# Patient Record
Sex: Female | Born: 1985 | Race: White | Hispanic: No | Smoking: Never smoker
Health system: Southern US, Community
[De-identification: ages and names within clinical notes are randomized; demographics above are authoritative.]

## PROBLEM LIST (undated history)

## (undated) DIAGNOSIS — Z87442 Personal history of urinary calculi: Secondary | ICD-10-CM

## (undated) DIAGNOSIS — Z87448 Personal history of other diseases of urinary system: Secondary | ICD-10-CM

## (undated) DIAGNOSIS — Z8669 Personal history of other diseases of the nervous system and sense organs: Secondary | ICD-10-CM

## (undated) DIAGNOSIS — K219 Gastro-esophageal reflux disease without esophagitis: Secondary | ICD-10-CM

## (undated) DIAGNOSIS — E079 Disorder of thyroid, unspecified: Secondary | ICD-10-CM

## (undated) DIAGNOSIS — M797 Fibromyalgia: Secondary | ICD-10-CM

## (undated) DIAGNOSIS — M1990 Unspecified osteoarthritis, unspecified site: Secondary | ICD-10-CM

## (undated) HISTORY — DX: Personal history of other diseases of the nervous system and sense organs: Z86.69

## (undated) HISTORY — DX: Personal history of urinary calculi: Z87.442

## (undated) HISTORY — DX: Fibromyalgia: M79.7

## (undated) HISTORY — DX: Gastro-esophageal reflux disease without esophagitis: K21.9

## (undated) HISTORY — DX: Unspecified osteoarthritis, unspecified site: M19.90

## (undated) HISTORY — DX: Disorder of thyroid, unspecified: E07.9

## (undated) HISTORY — DX: Personal history of other diseases of urinary system: Z87.448

---
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
## 2006-08-11 ENCOUNTER — Emergency Department: Payer: Self-pay | Admitting: Unknown Physician Specialty

## 2006-08-22 ENCOUNTER — Emergency Department: Payer: Self-pay | Admitting: Emergency Medicine

## 2006-11-03 ENCOUNTER — Emergency Department: Payer: Self-pay | Admitting: Unknown Physician Specialty

## 2007-02-07 ENCOUNTER — Observation Stay: Payer: Self-pay | Admitting: Unknown Physician Specialty

## 2007-02-08 ENCOUNTER — Ambulatory Visit: Payer: Self-pay | Admitting: Unknown Physician Specialty

## 2007-04-10 ENCOUNTER — Observation Stay: Payer: Self-pay | Admitting: Obstetrics and Gynecology

## 2007-04-20 ENCOUNTER — Observation Stay: Payer: Self-pay

## 2007-05-01 ENCOUNTER — Inpatient Hospital Stay: Payer: Self-pay | Admitting: Unknown Physician Specialty

## 2007-05-01 ENCOUNTER — Observation Stay: Payer: Self-pay | Admitting: Unknown Physician Specialty

## 2007-10-04 ENCOUNTER — Emergency Department: Payer: Self-pay | Admitting: Emergency Medicine

## 2008-02-03 ENCOUNTER — Emergency Department: Payer: Self-pay | Admitting: Emergency Medicine

## 2008-06-24 ENCOUNTER — Emergency Department: Payer: Self-pay | Admitting: Emergency Medicine

## 2009-01-01 ENCOUNTER — Observation Stay: Payer: Self-pay

## 2009-01-23 ENCOUNTER — Observation Stay: Payer: Self-pay

## 2009-02-03 ENCOUNTER — Ambulatory Visit: Payer: Self-pay | Admitting: Obstetrics & Gynecology

## 2009-02-04 ENCOUNTER — Inpatient Hospital Stay: Payer: Self-pay | Admitting: Obstetrics & Gynecology

## 2009-04-26 ENCOUNTER — Emergency Department: Payer: Self-pay | Admitting: Emergency Medicine

## 2009-08-23 ENCOUNTER — Emergency Department: Payer: Self-pay | Admitting: Emergency Medicine

## 2010-02-01 ENCOUNTER — Emergency Department: Payer: Self-pay | Admitting: Emergency Medicine

## 2010-11-13 DIAGNOSIS — Z87448 Personal history of other diseases of urinary system: Secondary | ICD-10-CM

## 2010-11-13 HISTORY — DX: Personal history of other diseases of urinary system: Z87.448

## 2010-11-13 HISTORY — PX: BREAST ENHANCEMENT SURGERY: SHX7

## 2011-07-31 ENCOUNTER — Emergency Department: Payer: Self-pay | Admitting: Emergency Medicine

## 2011-08-05 ENCOUNTER — Emergency Department: Payer: Self-pay | Admitting: Internal Medicine

## 2011-10-23 ENCOUNTER — Emergency Department: Payer: Self-pay | Admitting: Emergency Medicine

## 2011-10-24 ENCOUNTER — Emergency Department: Payer: Self-pay | Admitting: Emergency Medicine

## 2011-11-10 ENCOUNTER — Emergency Department: Payer: Self-pay | Admitting: Emergency Medicine

## 2011-11-14 ENCOUNTER — Encounter: Payer: Self-pay | Admitting: *Deleted

## 2011-11-14 ENCOUNTER — Observation Stay (HOSPITAL_COMMUNITY)
Admission: EM | Admit: 2011-11-14 | Discharge: 2011-11-15 | Disposition: A | Discharge status: Home / Self Care | Payer: BC Managed Care – PPO | Attending: Emergency Medicine | Admitting: Emergency Medicine

## 2011-11-14 DIAGNOSIS — R509 Fever, unspecified: Secondary | ICD-10-CM | POA: Insufficient documentation

## 2011-11-14 DIAGNOSIS — Z87442 Personal history of urinary calculi: Secondary | ICD-10-CM

## 2011-11-14 DIAGNOSIS — N12 Tubulo-interstitial nephritis, not specified as acute or chronic: Principal | ICD-10-CM | POA: Insufficient documentation

## 2011-11-14 DIAGNOSIS — R300 Dysuria: Secondary | ICD-10-CM | POA: Insufficient documentation

## 2011-11-14 DIAGNOSIS — R110 Nausea: Secondary | ICD-10-CM | POA: Insufficient documentation

## 2011-11-14 HISTORY — DX: Personal history of urinary calculi: Z87.442

## 2011-11-14 LAB — URINALYSIS, ROUTINE W REFLEX MICROSCOPIC
Bilirubin Urine: NEGATIVE
Glucose, UA: NEGATIVE mg/dL
Ketones, ur: NEGATIVE mg/dL
Nitrite: POSITIVE — AB
Protein, ur: 100 mg/dL — AB
Specific Gravity, Urine: 1.007 (ref 1.005–1.030)
Urobilinogen, UA: 1 mg/dL (ref 0.0–1.0)
pH: 6.5 (ref 5.0–8.0)

## 2011-11-14 LAB — URINE MICROSCOPIC-ADD ON

## 2011-11-14 LAB — POCT PREGNANCY, URINE: Preg Test, Ur: NEGATIVE

## 2011-11-14 MED ORDER — MORPHINE SULFATE 4 MG/ML IJ SOLN
4.0000 mg | Freq: Once | INTRAMUSCULAR | Status: AC
  Administered 2011-11-14: 4 mg via INTRAVENOUS
  Filled 2011-11-14: qty 1

## 2011-11-14 MED ORDER — SODIUM CHLORIDE 0.9 % IV BOLUS (SEPSIS)
1000.0000 mL | Freq: Once | INTRAVENOUS | Status: AC
  Administered 2011-11-14: 1000 mL via INTRAVENOUS

## 2011-11-14 MED ORDER — DEXTROSE 5 % IV SOLN
1.0000 g | Freq: Once | INTRAVENOUS | Status: AC
  Administered 2011-11-14: 1 g via INTRAVENOUS
  Filled 2011-11-14: qty 10

## 2011-11-14 MED ORDER — ONDANSETRON HCL 4 MG/2ML IJ SOLN
4.0000 mg | Freq: Once | INTRAMUSCULAR | Status: AC
  Administered 2011-11-14: 4 mg via INTRAVENOUS
  Filled 2011-11-14: qty 2

## 2011-11-14 NOTE — ED Notes (Signed)
She has had rt flank pain and painful urination for 5 days.  She was seen at an urgent care this am in [place] and given rxs but she did not get them filled she was told she had a uti.  lmp dec 1st

## 2011-11-14 NOTE — ED Provider Notes (Signed)
History     CSN: [id number]  Arrival date & time 11/14/11  2041   First MD Initiated Contact with Patient 11/14/11 2312      Chief Complaint  Patient presents with  . Flank Pain    (Consider location/radiation/quality/duration/timing/severity/associated sxs/prior treatment) HPI Comments: Patient with right flank pain that began yesterday. The patient is also having dysuria. She denies vaginal discharge. She has nausea but no vomiting. Patient has a low-grade fever in the 99's degrees Fahrenheit. Normal last menstrual period. Patient denies abdominal pain, upper respiratory tract infection symptoms, shortness of breath, cough.  Patient is a 26 y.o. female presenting with flank pain. The history is provided by the patient.  Flank Pain This is a new problem. The current episode started yesterday. The problem has been gradually worsening. Associated symptoms include a fever and nausea. Pertinent negatives include no abdominal pain, chest pain, chills, headaches, myalgias, rash, sore throat or vomiting. The symptoms are aggravated by nothing. She has tried nothing for the symptoms.  Flank Pain This is a new problem. The current episode started yesterday. The problem has been gradually worsening. Pertinent negatives include no chest pain, no abdominal pain, no headaches and no shortness of breath. The symptoms are aggravated by nothing. She has tried nothing for the symptoms.    Past Medical History  Diagnosis Date  . Hydronephrosis     History reviewed. No pertinent past surgical history.  History reviewed. No pertinent family history.  History  Substance Use Topics  . Smoking status: Never Smoker   . Smokeless tobacco: Not on file  . Alcohol Use: Yes    OB History    Grav Para Term Preterm Abortions TAB SAB Ect Mult Living                  Review of Systems  Constitutional: Positive for fever. Negative for chills.  HENT: Negative for sore throat and rhinorrhea.   Eyes:  Negative for discharge.  Respiratory: Negative for shortness of breath.   Cardiovascular: Negative for chest pain.  Gastrointestinal: Positive for nausea. Negative for vomiting, abdominal pain, diarrhea and constipation.  Genitourinary: Positive for dysuria and flank pain. Negative for hematuria, vaginal bleeding, vaginal discharge and pelvic pain.  Musculoskeletal: Negative for myalgias.  Skin: Negative for rash.  Neurological: Negative for headaches.  Psychiatric/Behavioral: Negative for confusion.    Allergies  Review of patient's allergies indicates no known allergies.  Home Medications  No current outpatient prescriptions on file.  BP 125/82  Pulse 111  Temp(Src) 99 F (37.2 C) (Oral)  Resp 18  SpO2 98%  LMP 10/14/2011  Physical Exam  Nursing note and vitals reviewed. Constitutional: She is oriented to person, place, and time. She appears well-developed and well-nourished.  HENT:  Head: Normocephalic and atraumatic.  Eyes: Pupils are equal, round, and reactive to light. Right eye exhibits no discharge. Left eye exhibits no discharge.  Neck: Normal range of motion. Neck supple.  Cardiovascular: Regular rhythm.   No murmur heard.      Mild tachycardia  Pulmonary/Chest: Effort normal and breath sounds normal. No respiratory distress. She has no wheezes.  Abdominal: Soft. Bowel sounds are normal. There is tenderness in the suprapubic area. There is CVA tenderness. There is no rebound, no guarding, no tenderness at McBurney's point and negative Murphy's sign.       Right CVA tenderness.  Musculoskeletal: Normal range of motion.  Neurological: She is alert and oriented to person, place, and time.  Skin: Skin is  warm and dry. No rash noted.  Psychiatric: She has a normal mood and affect.    ED Course  Procedures (including critical care time)  Labs Reviewed  URINALYSIS, ROUTINE W REFLEX MICROSCOPIC - Abnormal; Notable for the following:    Color, Urine ORANGE (*)  BIOCHEMICALS MAY BE AFFECTED BY COLOR   APPearance CLOUDY (*)    Hgb urine dipstick LARGE (*)    Protein, ur 100 (*)    Nitrite POSITIVE (*)    Leukocytes, UA LARGE (*)    All other components within normal limits  URINE MICROSCOPIC-ADD ON - Abnormal; Notable for the following:    Squamous Epithelial / LPF MANY (*)    Bacteria, UA MANY (*)    All other components within normal limits  POCT PREGNANCY, URINE  POCT PREGNANCY, URINE  URINE CULTURE   No results found.   1. Pyelonephritis     12:30 AM patient was seen and examined. IV fluids given. Pain medicine given. IV antibiotics ordered. Suspect pyelonephritis. Will monitor.  4:04 AM patient require more pain medicine. She is sleeping in the CDU.  5:57 AM patient seen. She states she is feeling much better. Will give patient breakfast and pain medicine. If she tolerates this will discharge home on PO Vantin, pain medicine, zofran. Patient urged to go to urgent care in 3 days for recheck of her urine. Culture sent. Handoff to [facility] PA-C who will discharge if patient tolerates PO's. Patient urged to return with persistent vomiting, worsening pain, persistent high fever. She verbalizes understanding and agrees with the plan.  MDM  Patients with pyelonephritis. Treated and improved in emergency department. She is tolerating fluids. Pain is improved. She is able to be discharged home outpatient therapy.   Eustace Moore [place], [place] 11/15/11 [phone]  Medical screening examination/treatment/procedure(s) were conducted as a shared visit with non-physician practitioner(s) and myself.  I personally evaluated the patient during the encounter. Patient evaluated for flank pain, tachycardia and symptoms and presentation consistent with pyelonephritis. Right CVA tenderness on exam. She is placed on is protocol overnight given IV antibiotics IV fluids and pain control. She tolerated these treatments well and on reassessment was doing much better. Urine  culture is pending patient to be discharged home with outpatient followup.  Sunnie Nielsen, MD 11/15/11 [phone]

## 2011-11-15 MED ORDER — OXYCODONE-ACETAMINOPHEN 5-325 MG PO TABS: 1.0000 | ORAL_TABLET | Freq: Four times a day (QID) | ORAL | Status: AC | PRN

## 2011-11-15 MED ORDER — ACETAMINOPHEN 325 MG PO TABS: 650.0000 mg | ORAL_TABLET | Freq: Four times a day (QID) | ORAL | Status: DC | PRN

## 2011-11-15 MED ORDER — ONDANSETRON 4 MG PO TBDP
ORAL_TABLET | ORAL | Status: AC
  Administered 2011-11-15: 4 mg
  Filled 2011-11-15: qty 1

## 2011-11-15 MED ORDER — OXYCODONE-ACETAMINOPHEN 5-325 MG PO TABS
ORAL_TABLET | ORAL | Status: AC
  Administered 2011-11-15: 1
  Filled 2011-11-15: qty 1

## 2011-11-15 MED ORDER — CEFPODOXIME PROXETIL 200 MG PO TABS: 200.0000 mg | ORAL_TABLET | Freq: Two times a day (BID) | ORAL | Status: AC

## 2011-11-15 MED ORDER — MORPHINE SULFATE 4 MG/ML IJ SOLN
6.0000 mg | Freq: Once | INTRAMUSCULAR | Status: AC
  Administered 2011-11-15: 6 mg via INTRAVENOUS
  Filled 2011-11-15: qty 2

## 2011-11-15 MED ORDER — IBUPROFEN 200 MG PO TABS: 600.0000 mg | ORAL_TABLET | ORAL | Status: DC | PRN

## 2011-11-15 MED ORDER — ONDANSETRON 8 MG PO TBDP: 8.0000 mg | ORAL_TABLET | Freq: Three times a day (TID) | ORAL | Status: AC | PRN

## 2011-11-15 MED ORDER — OXYCODONE-ACETAMINOPHEN 5-325 MG PO TABS
1.0000 | ORAL_TABLET | Freq: Once | ORAL | Status: AC
  Administered 2011-11-15: 1 via ORAL

## 2011-11-15 NOTE — ED Notes (Signed)
Phoned lab to add urine culture to previously submitted sample; sent requisition to lab

## 2011-11-15 NOTE — ED Notes (Signed)
Report given to [facility], Charity fundraiser.  Pt and family updated that she is moving to CDU.

## 2011-11-16 LAB — URINE CULTURE
Colony Count: >=100000
Culture  Setup Time: 201301020829

## 2011-11-17 NOTE — ED Notes (Signed)
+   urine Patient treated with Vantin-sensitive to same-chart appended per protocol MD.

## 2012-01-15 ENCOUNTER — Emergency Department: Payer: Self-pay | Admitting: Emergency Medicine

## 2012-01-15 LAB — PREGNANCY, URINE: Pregnancy Test, Urine: NEGATIVE m[IU]/mL

## 2012-06-22 ENCOUNTER — Observation Stay: Payer: Self-pay

## 2012-07-19 ENCOUNTER — Observation Stay: Payer: Self-pay | Admitting: Obstetrics & Gynecology

## 2012-07-19 LAB — FETAL FIBRONECTIN
Appearance: NORMAL
Fetal Fibronectin: NEGATIVE

## 2012-07-19 LAB — URINALYSIS, COMPLETE
Bacteria: NONE SEEN
Bilirubin,UR: NEGATIVE
Blood: NEGATIVE
Glucose,UR: NEGATIVE mg/dL (ref 0–75)
Leukocyte Esterase: NEGATIVE
Nitrite: NEGATIVE
Ph: 7 (ref 4.5–8.0)
Protein: NEGATIVE
RBC,UR: <1 /HPF (ref 0–5)
Specific Gravity: 1.003 (ref 1.003–1.030)
Squamous Epithelial: <1
WBC UR: NONE SEEN /HPF (ref 0–5)

## 2012-08-13 HISTORY — PX: TUBAL LIGATION: SHX77

## 2012-08-16 ENCOUNTER — Observation Stay: Payer: Self-pay | Admitting: Obstetrics and Gynecology

## 2012-08-16 LAB — CBC WITH DIFFERENTIAL/PLATELET
Basophil #: 0 10*3/uL (ref 0.0–0.1)
Basophil %: 0.4 %
Eosinophil #: 0.5 10*3/uL (ref 0.0–0.7)
Eosinophil %: 3.9 %
HCT: 30.4 % — ABNORMAL LOW (ref 35.0–47.0)
HGB: 10.5 g/dL — ABNORMAL LOW (ref 12.0–16.0)
Lymphocyte #: 2.1 10*3/uL (ref 1.0–3.6)
Lymphocyte %: 18.3 %
MCH: 29.4 pg (ref 26.0–34.0)
MCHC: 34.4 g/dL (ref 32.0–36.0)
MCV: 86 fL (ref 80–100)
Monocyte #: 0.5 x10 3/mm (ref 0.2–0.9)
Monocyte %: 3.9 %
Neutrophil #: 8.4 10*3/uL — ABNORMAL HIGH (ref 1.4–6.5)
Neutrophil %: 73.5 %
Platelet: 164 10*3/uL (ref 150–440)
RBC: 3.56 10*6/uL — ABNORMAL LOW (ref 3.80–5.20)
RDW: 13.2 % (ref 11.5–14.5)
WBC: 11.5 10*3/uL — ABNORMAL HIGH (ref 3.6–11.0)

## 2012-08-16 LAB — URINALYSIS, COMPLETE
Bacteria: NONE SEEN
Bilirubin,UR: NEGATIVE
Blood: NEGATIVE
Glucose,UR: NEGATIVE mg/dL (ref 0–75)
Leukocyte Esterase: NEGATIVE
Nitrite: NEGATIVE
Ph: 6 (ref 4.5–8.0)
Protein: NEGATIVE
RBC,UR: NONE SEEN /HPF (ref 0–5)
Specific Gravity: 1.01 (ref 1.003–1.030)
Squamous Epithelial: 1
WBC UR: 1 /HPF (ref 0–5)

## 2012-08-16 LAB — BASIC METABOLIC PANEL
Anion Gap: 11 (ref 7–16)
BUN: 4 mg/dL — ABNORMAL LOW (ref 7–18)
Calcium, Total: 8.2 mg/dL — ABNORMAL LOW (ref 8.5–10.1)
Chloride: 107 mmol/L (ref 98–107)
Co2: 22 mmol/L (ref 21–32)
Creatinine: 0.44 mg/dL — ABNORMAL LOW (ref 0.60–1.30)
EGFR (African American): >60
EGFR (Non-African Amer.): >60
Glucose: 76 mg/dL (ref 65–99)
Osmolality: 275 (ref 275–301)
Potassium: 3.2 mmol/L — ABNORMAL LOW (ref 3.5–5.1)
Sodium: 140 mmol/L (ref 136–145)

## 2012-08-18 LAB — URINE CULTURE

## 2012-09-02 ENCOUNTER — Observation Stay: Payer: Self-pay

## 2012-09-03 LAB — CBC WITH DIFFERENTIAL/PLATELET
Basophil #: 0.1 10*3/uL
Basophil %: 0.5 %
Eosinophil #: 0.6 10*3/uL
Eosinophil %: 3.7 %
HCT: 33.7 % — ABNORMAL LOW
HGB: 11.6 g/dL — ABNORMAL LOW
Lymphocyte %: 16.3 %
Lymphs Abs: 2.5 10*3/uL
MCH: 29.5 pg
MCHC: 34.5 g/dL
MCV: 86 fL
Monocyte #: 0.7 10*3/uL
Monocyte %: 4.6 %
Neutrophil #: 11.5 10*3/uL — ABNORMAL HIGH
Neutrophil %: 74.9 %
Platelet: 207 10*3/uL
RBC: 3.94 X10 6/mm 3
RDW: 13.2 %
WBC: 15.3 10*3/uL — ABNORMAL HIGH

## 2012-09-05 ENCOUNTER — Encounter: Payer: Self-pay | Admitting: Obstetrics and Gynecology

## 2012-09-09 LAB — CBC WITH DIFFERENTIAL/PLATELET
Basophil #: 0.1 10*3/uL (ref 0.0–0.1)
Basophil %: 0.4 %
Eosinophil #: 0.5 10*3/uL (ref 0.0–0.7)
Eosinophil %: 3.2 %
HCT: 34.4 % — ABNORMAL LOW (ref 35.0–47.0)
HGB: 11.6 g/dL — ABNORMAL LOW (ref 12.0–16.0)
Lymphocyte #: 1.8 10*3/uL (ref 1.0–3.6)
Lymphocyte %: 13.1 %
MCH: 28.9 pg (ref 26.0–34.0)
MCHC: 33.9 g/dL (ref 32.0–36.0)
MCV: 85 fL (ref 80–100)
Monocyte #: 0.6 x10 3/mm (ref 0.2–0.9)
Monocyte %: 4.2 %
Neutrophil #: 11.1 10*3/uL — ABNORMAL HIGH (ref 1.4–6.5)
Neutrophil %: 79.1 %
Platelet: 215 10*3/uL (ref 150–440)
RBC: 4.03 10*6/uL (ref 3.80–5.20)
RDW: 13.4 % (ref 11.5–14.5)
WBC: 14.1 10*3/uL — ABNORMAL HIGH (ref 3.6–11.0)

## 2012-09-10 ENCOUNTER — Inpatient Hospital Stay: Payer: Self-pay | Admitting: Obstetrics & Gynecology

## 2012-09-11 LAB — HEMATOCRIT: HCT: 32.5 % — ABNORMAL LOW (ref 35.0–47.0)

## 2012-09-12 LAB — PATHOLOGY REPORT

## 2013-01-24 ENCOUNTER — Ambulatory Visit (INDEPENDENT_AMBULATORY_CARE_PROVIDER_SITE_OTHER): Payer: BC Managed Care – PPO | Admitting: Family Medicine

## 2013-01-24 ENCOUNTER — Encounter: Payer: Self-pay | Admitting: Family Medicine

## 2013-01-24 VITALS — BP 112/64 | HR 80 | Temp 99.2°F | Ht 67.0 in | Wt 127.0 lb

## 2013-01-24 DIAGNOSIS — G4700 Insomnia, unspecified: Secondary | ICD-10-CM | POA: Insufficient documentation

## 2013-01-24 DIAGNOSIS — R519 Headache, unspecified: Secondary | ICD-10-CM | POA: Insufficient documentation

## 2013-01-24 DIAGNOSIS — R51 Headache: Secondary | ICD-10-CM | POA: Insufficient documentation

## 2013-01-24 MED ORDER — CYCLOBENZAPRINE HCL 10 MG PO TABS: 10.0000 mg | ORAL_TABLET | Freq: Two times a day (BID) | ORAL | Status: DC | PRN

## 2013-01-24 NOTE — Assessment & Plan Note (Signed)
What sound like chronic daily headaches vs MOH.  Not consistent with migraines. Recommend stop analgesics, use flexeril abortive treatment. Keep headache diary for next visit. If not improved, will recommend daily headache medication. Consider further evaluating for anxiety. See below.

## 2013-01-24 NOTE — Assessment & Plan Note (Signed)
Likely contributing to above. Discussed sleep hygiene, provided with handout.

## 2013-01-24 NOTE — Progress Notes (Addendum)
Subjective:    Patient ID: Judith Martinez, female    DOB: December 17, 1985, 27 y.o.   MRN: [id number]  HPI CC: new pt to establish  H/o migraines in past.   Worsened last few weeks.  Describes dull ache on top of head.  Wakes up with headache.  Constant headache.  Nauseated intermittently.  Sunlight makes pain worse.  Current headaches not as strong as prior migraines.  At worst 7/10 pain, normally stay at 4/10 pain. Denies vision changes, photophobia. Taking ibuprofen OTC 2-3 times daily every day, also takes tylenol.   Hasn't found any triggers for headaches. Caffeine: cut back  Caffeine: cut back Lives with husband and 5 children, 1 dog Occupation: stay at home mom Edu: HS Activity: exercises at home, walks dog Diet: good water, fruits/vegetables daily, red meat 2x/wk, fish 2x/wk  Trouble sleeping for last few months.   Both initiation and maintenance.  Staying fatigued.  Some times 1-2 hours, max 4-5 hours sleep. Takes care of 5 children at home - 8yo, 5yo, 3yo, 2yo, 5 mo.  Preventative: Well woman with Consuella Lose , last pap 08/2012 WNL LMP 01/14/2013 Tetanus shot - 2013 (Tdap)  Medications and allergies reviewed and updated in chart.  Past histories reviewed and updated if relevant as below. There is no problem list on file for this patient.  Past Medical History  Diagnosis Date  . Headache     frequent  . History of kidney stones 2013  . Hx of migraines   . History of pyelonephritis 2012   Past Surgical History  Procedure Laterality Date  . Tonsillectomy  1994  . Breast enhancement surgery  2012  . Tubal ligation  10/13   History  Substance Use Topics  . Smoking status: Never Smoker   . Smokeless tobacco: Never Used  . Alcohol Use: Yes     Comment: Occasional   Family History  Problem Relation Age of Onset  . Hypertension Father   . CAD Paternal Grandfather   . Parkinson's disease Paternal Grandfather   . Cancer Paternal Grandfather     leukemia  . Cancer  Sister 4    tumor in leg  . Cancer Other     maternal great grandmother; breast  . Migraines Father   . Hyperlipidemia Father   . Stroke Paternal Grandfather   . Diabetes Paternal Grandmother    No Known Allergies No current outpatient prescriptions on file prior to visit.   No current facility-administered medications on file prior to visit.     Review of Systems  Constitutional: Negative for fever, chills, activity change, appetite change, fatigue and unexpected weight change.  HENT: Negative for hearing loss and neck pain.   Eyes: Negative for visual disturbance.  Respiratory: Positive for shortness of breath. Negative for cough, chest tightness and wheezing.   Cardiovascular: Negative for chest pain, palpitations and leg swelling.  Gastrointestinal: Negative for nausea, vomiting, abdominal pain, diarrhea, constipation, blood in stool and abdominal distention.  Genitourinary: Negative for hematuria and difficulty urinating.  Musculoskeletal: Negative for myalgias and arthralgias.  Skin: Negative for rash.  Neurological: Positive for headaches. Negative for dizziness, seizures and syncope.  Hematological: Does not bruise/bleed easily.  Psychiatric/Behavioral: Negative for dysphoric mood. The patient is nervous/anxious.        More irritable       Objective:   Physical Exam  Nursing note and vitals reviewed. Constitutional: She is oriented to person, place, and time. She appears well-developed and well-nourished. No distress.  HENT:  Head: Normocephalic and atraumatic.  Right Ear: Hearing, tympanic membrane, external ear and ear canal normal.  Left Ear: Hearing, tympanic membrane, external ear and ear canal normal.  Nose: Nose normal.  Mouth/Throat: Oropharynx is clear and moist. No oropharyngeal exudate.  Eyes: Conjunctivae and EOM are normal. Pupils are equal, round, and reactive to light. No scleral icterus.  Neck: Normal range of motion. Neck supple. No thyromegaly  present.  Cardiovascular: Normal rate, regular rhythm, normal heart sounds and intact distal pulses.   No murmur heard. Pulses:      Radial pulses are 2+ on the right side, and 2+ on the left side.  Pulmonary/Chest: Effort normal and breath sounds normal. No respiratory distress. She has no wheezes. She has no rales.  Musculoskeletal: Normal range of motion. She exhibits no edema.  Lymphadenopathy:    She has no cervical adenopathy.  Neurological: She is alert and oriented to person, place, and time. She has normal strength. No cranial nerve deficit or sensory deficit. She displays a negative Romberg sign. Coordination and gait normal.  Reflex Scores:      Patellar reflexes are 2+ on the right side and 2+ on the left side. CN 2-12 intact Station and gait intact. Normal FTN, HTS Neg pronator drift  Skin: Skin is warm and dry. No rash noted.  Psychiatric: She has a normal mood and affect. Her behavior is normal. Judgment and thought content normal.       Assessment & Plan:

## 2013-01-24 NOTE — Patient Instructions (Addendum)
Try to back off ibuprofen and tylenol. Start flexeril as needed for headaches. Keep headache diary. Return in 3-4 weeks for follow up of headaches.  Insomnia Insomnia is frequent trouble falling and/or staying asleep. Insomnia can be a long term problem or a short term problem. Both are common. Insomnia can be a short term problem when the wakefulness is related to a certain stress or worry. Long term insomnia is often related to ongoing stress during waking hours and/or poor sleeping habits. Overtime, sleep deprivation itself can make the problem worse. Every little thing feels more severe because you are overtired and your ability to cope is decreased. CAUSES   Stress, anxiety, and depression.  Poor sleeping habits.  Distractions such as TV in the bedroom.  Naps close to bedtime.  Engaging in emotionally charged conversations before bed.  Technical reading before sleep.  Alcohol and other sedatives. They may make the problem worse. They can hurt normal sleep patterns and normal dream activity.  Stimulants such as caffeine for several hours prior to bedtime.  Pain syndromes and shortness of breath can cause insomnia.  Exercise late at night.  Changing time zones may cause sleeping problems (jet lag). It is sometimes helpful to have someone observe your sleeping patterns. They should look for periods of not breathing during the night (sleep apnea). They should also look to see how long those periods last. If you live alone or observers are uncertain, you can also be observed at a sleep clinic where your sleep patterns will be professionally monitored. Sleep apnea requires a checkup and treatment. Give your caregivers your medical history. Give your caregivers observations your family has made about your sleep.  SYMPTOMS   Not feeling rested in the morning.  Anxiety and restlessness at bedtime.  Difficulty falling and staying asleep. TREATMENT   Your caregiver may prescribe  treatment for an underlying medical disorders. Your caregiver can give advice or help if you are using alcohol or other drugs for self-medication. Treatment of underlying problems will usually eliminate insomnia problems.  Medications can be prescribed for short time use. They are generally not recommended for lengthy use.  Over-the-counter sleep medicines are not recommended for lengthy use. They can be habit forming.  You can promote easier sleeping by making lifestyle changes such as:  Using relaxation techniques that help with breathing and reduce muscle tension.  Exercising earlier in the day.  Changing your diet and the time of your last meal. No night time snacks.  Establish a regular time to go to bed.  Counseling can help with stressful problems and worry.  Soothing music and white noise may be helpful if there are background noises you cannot remove.  Stop tedious detailed work at least one hour before bedtime. HOME CARE INSTRUCTIONS   Keep a diary. Inform your caregiver about your progress. This includes any medication side effects. See your caregiver regularly. Take note of:  Times when you are asleep.  Times when you are awake during the night.  The quality of your sleep.  How you feel the next day. This information will help your caregiver care for you.  Get out of bed if you are still awake after 15 minutes. Read or do some quiet activity. Keep the lights down. Wait until you feel sleepy and go back to bed.  Keep regular sleeping and waking hours. Avoid naps.  Exercise regularly.  Avoid distractions at bedtime. Distractions include watching television or engaging in any intense or detailed activity like  attempting to balance the household checkbook.  Develop a bedtime ritual. Keep a familiar routine of bathing, brushing your teeth, climbing into bed at the same time each night, listening to soothing music. Routines increase the success of falling to sleep  faster.  Use relaxation techniques. This can be using breathing and muscle tension release routines. It can also include visualizing peaceful scenes. You can also help control troubling or intruding thoughts by keeping your mind occupied with boring or repetitive thoughts like the old concept of counting sheep. You can make it more creative like imagining planting one beautiful flower after another in your backyard garden.  During your day, work to eliminate stress. When this is not possible use some of the previous suggestions to help reduce the anxiety that accompanies stressful situations. MAKE SURE YOU:   Understand these instructions.  Will watch your condition.  Will get help right away if you are not doing well or get worse. Document Released: 10/27/2000 Document Revised: 01/22/2012 Document Reviewed: 11/27/2007 [facility] Patient Information 2013 [place], [place].

## 2013-02-13 ENCOUNTER — Encounter: Payer: Self-pay | Admitting: Family Medicine

## 2013-02-13 ENCOUNTER — Ambulatory Visit: Payer: BC Managed Care – PPO

## 2013-02-13 ENCOUNTER — Ambulatory Visit: Payer: BC Managed Care – PPO | Admitting: Family Medicine

## 2013-02-13 ENCOUNTER — Ambulatory Visit (INDEPENDENT_AMBULATORY_CARE_PROVIDER_SITE_OTHER): Payer: BC Managed Care – PPO | Admitting: Family Medicine

## 2013-02-13 VITALS — BP 110/80 | HR 116 | Temp 98.6°F | Ht 66.0 in | Wt 129.5 lb

## 2013-02-13 DIAGNOSIS — M2550 Pain in unspecified joint: Secondary | ICD-10-CM

## 2013-02-13 DIAGNOSIS — F411 Generalized anxiety disorder: Secondary | ICD-10-CM

## 2013-02-13 DIAGNOSIS — F419 Anxiety disorder, unspecified: Secondary | ICD-10-CM

## 2013-02-13 DIAGNOSIS — R51 Headache: Secondary | ICD-10-CM

## 2013-02-13 LAB — COMPREHENSIVE METABOLIC PANEL
ALT: 17 U/L (ref 0–35)
AST: 17 U/L (ref 0–37)
Albumin: 4.6 g/dL (ref 3.5–5.2)
Alkaline Phosphatase: 50 U/L (ref 39–117)
BUN: 8 mg/dL (ref 6–23)
CO2: 25 mEq/L (ref 19–32)
Calcium: 9.3 mg/dL (ref 8.4–10.5)
Chloride: 100 mEq/L (ref 96–112)
Creatinine, Ser: 0.6 mg/dL (ref 0.4–1.2)
GFR: 131.29 mL/min (ref >60.00)
Glucose, Bld: 80 mg/dL (ref 70–99)
Potassium: 3.5 mEq/L (ref 3.5–5.1)
Sodium: 134 mEq/L — ABNORMAL LOW (ref 135–145)
Total Bilirubin: 0.5 mg/dL (ref 0.3–1.2)
Total Protein: 8.1 g/dL (ref 6.0–8.3)

## 2013-02-13 LAB — CBC WITH DIFFERENTIAL/PLATELET
Basophils Absolute: 0 10*3/uL (ref 0.0–0.1)
Basophils Relative: 0.4 % (ref 0.0–3.0)
Eosinophils Absolute: 0.4 10*3/uL (ref 0.0–0.7)
Eosinophils Relative: 6.2 % — ABNORMAL HIGH (ref 0.0–5.0)
HCT: 41.1 % (ref 36.0–46.0)
Hemoglobin: 13.8 g/dL (ref 12.0–15.0)
Lymphocytes Relative: 13.2 % (ref 12.0–46.0)
Lymphs Abs: 0.9 10*3/uL (ref 0.7–4.0)
MCHC: 33.5 g/dL (ref 30.0–36.0)
MCV: 87.4 fl (ref 78.0–100.0)
Monocytes Absolute: 0.3 10*3/uL (ref 0.1–1.0)
Monocytes Relative: 5.1 % (ref 3.0–12.0)
Neutro Abs: 5.1 10*3/uL (ref 1.4–7.7)
Neutrophils Relative %: 75.1 % (ref 43.0–77.0)
Platelets: 204 10*3/uL (ref 150.0–400.0)
RBC: 4.7 Mil/uL (ref 3.87–5.11)
RDW: 12.5 % (ref 11.5–14.6)
WBC: 6.8 10*3/uL (ref 4.5–10.5)

## 2013-02-13 LAB — HIGH SENSITIVITY CRP: CRP, High Sensitivity: 6.04 mg/L — ABNORMAL HIGH (ref 0.000–5.000)

## 2013-02-13 LAB — SEDIMENTATION RATE: Sed Rate: 1 mm/hr (ref 0–22)

## 2013-02-13 LAB — TSH: TSH: 2.27 u[IU]/mL (ref 0.35–5.50)

## 2013-02-13 MED ORDER — TOPIRAMATE 25 MG PO TABS: 50.0000 mg | ORAL_TABLET | Freq: Two times a day (BID) | ORAL | Status: DC

## 2013-02-13 MED ORDER — BUTALBITAL-APAP-CAFFEINE 50-325-40 MG PO TABS: 1.0000 | ORAL_TABLET | Freq: Four times a day (QID) | ORAL | Status: DC | PRN

## 2013-02-13 NOTE — Progress Notes (Signed)
  Subjective:    Patient ID: Judith Martinez, female    DOB: [birth year], 27 y.o.   MRN: [id number]  HPI CC: f/u HA  See prior note for details. Not doing well.   Persistent headaches.  Currently with bilateral temple pounding headache.  This one started early today at 1am.  With phonophobia.  No photo/nausea. Flexeril didn't help at all. Did stop other analgesics (tylenol/ibuprofen) without improvement in headaches pointing against MOH as cause.  Tried bayer extra strength aspirin.  Also having throbbing pain in bilateral knees - travels up and down legs.  Also with bilateral shoulder and neck pain. H/o chronic neck tightness.  No hip pain, ankle pain, elbow wrist or finger pain. No recent fevers, chest pain/tightness, SOB, nausea or diarrhea, new skin rashes, skin or hair changes.  No vision changes recently.  H/o BTL. Mother with fibromyalgia, wonders about this. Endorses occasional mental fog.  Denies depression issues.   + anxiety issues, not changing recently.  Has not restarted exercising.  Past Medical History  Diagnosis Date  . Headache     frequent  . History of kidney stones 2013  . Hx of migraines   . History of pyelonephritis 2012    Family History  Problem Relation Age of Onset  . Hypertension Father   . CAD Paternal Grandfather   . Parkinson's disease Paternal Grandfather   . Cancer Paternal Grandfather     leukemia  . Cancer Sister 4    tumor in leg  . Cancer Other     maternal great grandmother; breast  . Migraines Father   . Hyperlipidemia Father   . Stroke Paternal Grandfather   . Diabetes Paternal Grandmother    Review of Systems Per HPI    Objective:   Physical Exam  Nursing note and vitals reviewed. Constitutional: She appears well-developed and well-nourished. No distress.  HENT:  Head: Normocephalic and atraumatic.  Mouth/Throat: Oropharynx is clear and moist. No oropharyngeal exudate.  No temporal pain to palpation  Eyes: Conjunctivae  and EOM are normal. Pupils are equal, round, and reactive to light. No scleral icterus.  Neck: Normal range of motion. Neck supple.  Cardiovascular: Normal rate, regular rhythm, normal heart sounds and intact distal pulses.   No murmur heard. Pulmonary/Chest: Effort normal and breath sounds normal. No respiratory distress. She has no wheezes. She has no rales.  Musculoskeletal:  FROM at shoulders FROM at knees, no pain to palpation currently. Trapezius tenderness bilaterally       Assessment & Plan:

## 2013-02-13 NOTE — Patient Instructions (Addendum)
Stop flexeril.  May do trial of skelaxin samples as muscle relaxant (1/2 to 1 pill at a time). Start topamax at 25mg  daily for 4 days then increase to 50mg  daily - to help prevent daily headaches. Use fioricet to stop headache - try to take at onset of headache. Blood work today. Return in 3-4 weeks for follow up, sooner if worsening or concerns.

## 2013-02-14 ENCOUNTER — Encounter: Payer: Self-pay | Admitting: Family Medicine

## 2013-02-14 DIAGNOSIS — M2550 Pain in unspecified joint: Secondary | ICD-10-CM | POA: Insufficient documentation

## 2013-02-14 DIAGNOSIS — F4322 Adjustment disorder with anxiety: Secondary | ICD-10-CM | POA: Insufficient documentation

## 2013-02-14 NOTE — Assessment & Plan Note (Signed)
Bilateral knee aches as well as neck pain, however no pain on exam Treat chronic neck tightness with skelaxin prn (samples provided today)

## 2013-02-14 NOTE — Assessment & Plan Note (Addendum)
Chronic daily headaches, today described as bilateral temple throbbing pain but nontender to palpation. Persistent despite trial of flexeril and decreasing other analgesics. Did not keep headache diary, but denies any specific triggers for headaches. Given frequent headaches (more than 3 x/wk), will do trial of headache prevention medication topamax.  discussed common side effects fioricet as abortive therapy - discussed sedation precautions. For neck tightness - sent home with skelaxin samples.  Doesn't breastfeed. rtc 3-4 wks for f/u, sooner if needed

## 2013-02-14 NOTE — Assessment & Plan Note (Signed)
Does endorse chronic trouble with anxiety  GAD7 = 11/21 PHQ9 = 10/27, somewhat to very difficult dealing with day to day activities. Consider anxiolytic if this isn't improved with better control of headaches. Will need to assess for fibro next visit if no improvement.

## 2013-02-17 ENCOUNTER — Telehealth: Payer: Self-pay

## 2013-02-17 DIAGNOSIS — R51 Headache: Secondary | ICD-10-CM

## 2013-02-17 NOTE — Telephone Encounter (Signed)
Pt said seen 02/13/13 and h/a no better and when bends upper back and shoulders are very sore; 02/16/13  Started taking 2 of topamax and not helping and fiorcet not helping h/a. Pt does not want to schedule another appt until Dr Reece Agar is aware of pts present symptoms; pt wants to know if needs referral to specialist. Pain level now is 7.Please advise. [facility].

## 2013-02-17 NOTE — Telephone Encounter (Signed)
Skelaxin did not help neck tightness. fioricet does not help headache. topamax so far has not helped decrease frequency of headaches. Will refer to headache wellness center. Placed referral and will route to [facility].

## 2013-02-18 ENCOUNTER — Telehealth: Payer: Self-pay | Admitting: Family Medicine

## 2013-02-18 NOTE — Telephone Encounter (Signed)
Called patient about the Neurology referral and she asked me to ask you to put a referral in to see a Rheumatologist also for her myalgias.

## 2013-02-18 NOTE — Telephone Encounter (Signed)
Message left advising patient to expect call from [facility] for referral.

## 2013-02-18 NOTE — Telephone Encounter (Signed)
Marion transferred call back to me; pt said Shirlee Limerick said it could be weeks before she can get appt with specialist; pt would like pain med sent to [facility] while waiting on referral to neurologist and rheumatologist.Please advise.

## 2013-02-19 MED ORDER — TRAMADOL HCL 50 MG PO TABS: 50.0000 mg | ORAL_TABLET | Freq: Three times a day (TID) | ORAL | Status: DC | PRN

## 2013-02-19 NOTE — Telephone Encounter (Signed)
Patient notified

## 2013-02-19 NOTE — Telephone Encounter (Signed)
Will discuss this at office visit tomorrow.

## 2013-02-19 NOTE — Addendum Note (Signed)
Addended by: Eustaquio Boyden on: 02/19/2013 07:38 AM   Modules accepted: Orders

## 2013-02-19 NOTE — Telephone Encounter (Addendum)
plz notify I've sent tramadol prescription to pharmacy - take with caution, don't mix with fioricet (butalbital) or flexeril (cyclobenzaprine).

## 2013-02-20 ENCOUNTER — Ambulatory Visit: Payer: BC Managed Care – PPO | Admitting: Family Medicine

## 2013-02-20 DIAGNOSIS — Z0289 Encounter for other administrative examinations: Secondary | ICD-10-CM

## 2013-03-05 ENCOUNTER — Ambulatory Visit: Payer: BC Managed Care – PPO | Admitting: Neurology

## 2013-03-06 ENCOUNTER — Ambulatory Visit: Payer: BC Managed Care – PPO | Admitting: Family Medicine

## 2013-03-06 DIAGNOSIS — Z0289 Encounter for other administrative examinations: Secondary | ICD-10-CM

## 2013-12-20 ENCOUNTER — Emergency Department: Payer: Self-pay | Admitting: Emergency Medicine

## 2014-03-19 ENCOUNTER — Ambulatory Visit (INDEPENDENT_AMBULATORY_CARE_PROVIDER_SITE_OTHER): Payer: BC Managed Care – PPO | Admitting: Family Medicine

## 2014-03-19 ENCOUNTER — Encounter: Payer: Self-pay | Admitting: Family Medicine

## 2014-03-19 VITALS — BP 118/86 | HR 72 | Temp 97.9°F | Wt 141.0 lb

## 2014-03-19 DIAGNOSIS — F4322 Adjustment disorder with anxiety: Secondary | ICD-10-CM

## 2014-03-19 MED ORDER — ALPRAZOLAM 0.5 MG PO TABS: 0.5000 mg | ORAL_TABLET | Freq: Two times a day (BID) | ORAL | Status: DC | PRN

## 2014-03-19 MED ORDER — SERTRALINE HCL 25 MG PO TABS: 25.0000 mg | ORAL_TABLET | Freq: Every day | ORAL | Status: DC

## 2014-03-19 NOTE — Assessment & Plan Note (Signed)
Marked family stress leading to feeling overwhelmed as well as what sound like anxiety attacks. Start sertraline 25mg  daily, start xanax temporarily 0.5mg  1-2 tab prn anxiety. Discussed possible counseling down the road. Encouraged she get plugged into local church group. Support provided.  A total of 25 minutes were spent face-to-face with the patient during this encounter and over half of that time was spent on counseling and coordination of care

## 2014-03-19 NOTE — Progress Notes (Signed)
Pre visit review using our clinic review tool, if applicable. No additional management support is needed unless otherwise documented below in the visit note. 
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
## 2014-03-19 NOTE — Patient Instructions (Signed)
I'm sorry you're having to deal with all this stress! Start sertraline 25mg  daily. May use xanax 0.5mg   1-2 tablets twice daily as needed (start with 1 tablet). Call me with an update in 1-2 weeks. Return as needed or in 1-2 months for follow up. We may consider counseling down the road. Hang in there!

## 2014-03-25 ENCOUNTER — Telehealth: Payer: Self-pay

## 2014-03-25 MED ORDER — LORAZEPAM 0.5 MG PO TABS: 0.5000 mg | ORAL_TABLET | Freq: Two times a day (BID) | ORAL | Status: DC | PRN

## 2014-03-25 NOTE — Telephone Encounter (Signed)
Pt was not called for f/u yesterday. Is she taking sertraline 25mg  daily as well?   If xanax hasn't helped I recommend stop this and start ativan 0.5mg  1-2 tablets twice daily prn anxiety - longer acting medication.  plz phone in. Call us with update if 1 week.

## 2014-03-25 NOTE — Telephone Encounter (Signed)
Dr Reece AgarG is out - let her know I will let him see this in the am

## 2014-03-25 NOTE — Telephone Encounter (Signed)
Pt left v/m; pt was seen on 03/19/14; pt thought xanax was going to be higher dose than 0.5 mg; pt taking xanax 0.5 mg two tabs twice a day and is not effective for anxiety. [facility]. No SI/HI. Pt wants answer back today.

## 2014-03-26 NOTE — Telephone Encounter (Signed)
Spoke with patient. She is taking sertraline. Advised to continue and stop xanax. Advised I would call in lorazepam and for her to call with an update in 1 week or sooner if problems. She verbalized understanding.

## 2014-03-30 MED ORDER — CLONAZEPAM 0.5 MG PO TABS: 0.5000 mg | ORAL_TABLET | Freq: Two times a day (BID) | ORAL | Status: DC | PRN

## 2014-03-30 MED ORDER — SERTRALINE HCL 50 MG PO TABS: 50.0000 mg | ORAL_TABLET | Freq: Every day | ORAL | Status: DC

## 2014-03-30 NOTE — Telephone Encounter (Signed)
Patient notified and verbalized understanding. Rx called in as directed. 

## 2014-03-30 NOTE — Telephone Encounter (Signed)
Pt stopped xanax as instructed and started taking Lorazepam 0.5 mg taking 2 tabs once or twice a day; pt continues taking Sertraline 25 mg daily. Pt stopped Lorazepam on 03/29/14 due to lorazepam was making pt more emotional, pt was getting upset over nothing and easily angered. Pt said NO SI/HI. Pt does not want to come in for appt due to taking care of her 5 children.[facility]. Pt also said when pt started Xanax lt eye became swollen and is crusty in AM; does not appear red. Continues to have eye issue even thou stopped Xanax. Pt still does not want to make an appt.`Pt request cb.

## 2014-03-30 NOTE — Addendum Note (Signed)
Addended by: Eustaquio BoydenGUTIERREZ, Mekayla Soman on: 03/30/2014 09:13 AM   Modules accepted: Orders

## 2014-03-30 NOTE — Telephone Encounter (Addendum)
Xanax, ativan did not help anxiety. Would have her try klonopin - long acting anti anxiety medication.  plz phone in klonopin and will do 10 d course to see if will be effective. Also recommend increasing sertraline to 2 tablets daily (50mg ) and new dose for 50mg  will be at pharmacy. If this doesn't help recommend referral to psychiatry to discuss anxiety. Recommend OTC lubricating eye drops like hypotears and cool compress to eye - and evaluation for eye if sxs persistent.

## 2014-04-08 MED ORDER — CLONAZEPAM 0.5 MG PO TABS: 0.5000 mg | ORAL_TABLET | Freq: Two times a day (BID) | ORAL | Status: DC | PRN

## 2014-04-08 NOTE — Addendum Note (Signed)
Addended by: Eustaquio Boyden on: 04/08/2014 11:04 PM   Modules accepted: Orders

## 2014-04-08 NOTE — Telephone Encounter (Signed)
Pt is out of Klonopin; pt is feeling less anxious since taking Klonopin and request refill to [facility].Please advise. Pt does not have med for this evening and Dr Reece Agar is out of office; did review med refill policy with pt.

## 2014-04-08 NOTE — Addendum Note (Signed)
Addended by: Joaquim Nam on: 04/08/2014 06:56 PM   Modules accepted: Orders, Medications

## 2014-04-08 NOTE — Telephone Encounter (Addendum)
Dr. Jacqulyn Bath I presume was [facility] doc who started xanax prescription. Plz phone in klonopin prescription for patient.  #50 no refills.

## 2014-04-08 NOTE — Telephone Encounter (Signed)
[facility] and [facility] to [facility]. Was advised that patient got Alprazolam from Dr. Jacqulyn Bath on 03/18/14. Marchelle Folks stated that patient has been doctor and pharmacy hopping. Dr. Para March aware and declined to call the refill in for patient. Patient advised that Dr. Para March did not feel comfortable calling this refill in for her and the request would be sent to Dr. Reece Agar.

## 2014-04-08 NOTE — Telephone Encounter (Signed)
please call in and have her f/u with Dr. Reece Agar.  Thanks.

## 2014-04-09 NOTE — Telephone Encounter (Signed)
Rx called in as directed.   

## 2014-06-25 ENCOUNTER — Emergency Department: Payer: Self-pay | Admitting: Emergency Medicine

## 2014-06-25 LAB — CBC WITH DIFFERENTIAL/PLATELET
Basophil #: 0 10*3/uL (ref 0.0–0.1)
Basophil %: 0.5 %
Eosinophil #: 0.3 10*3/uL (ref 0.0–0.7)
Eosinophil %: 3.3 %
HCT: 39.2 % (ref 35.0–47.0)
HGB: 13.3 g/dL (ref 12.0–16.0)
Lymphocyte #: 2.3 10*3/uL (ref 1.0–3.6)
Lymphocyte %: 26.7 %
MCH: 29.9 pg (ref 26.0–34.0)
MCHC: 33.8 g/dL (ref 32.0–36.0)
MCV: 88 fL (ref 80–100)
Monocyte #: 0.5 x10 3/mm (ref 0.2–0.9)
Monocyte %: 5.2 %
Neutrophil #: 5.6 10*3/uL (ref 1.4–6.5)
Neutrophil %: 64.3 %
Platelet: 223 10*3/uL (ref 150–440)
RBC: 4.44 10*6/uL (ref 3.80–5.20)
RDW: 12.4 % (ref 11.5–14.5)
WBC: 8.7 10*3/uL (ref 3.6–11.0)

## 2014-06-25 LAB — COMPREHENSIVE METABOLIC PANEL
Albumin: 3.7 g/dL (ref 3.4–5.0)
Alkaline Phosphatase: 41 U/L — ABNORMAL LOW
Anion Gap: 5 — ABNORMAL LOW (ref 7–16)
BUN: 11 mg/dL (ref 7–18)
Bilirubin,Total: 0.3 mg/dL (ref 0.2–1.0)
Calcium, Total: 8.6 mg/dL (ref 8.5–10.1)
Chloride: 109 mmol/L — ABNORMAL HIGH (ref 98–107)
Co2: 26 mmol/L (ref 21–32)
Creatinine: 0.72 mg/dL (ref 0.60–1.30)
EGFR (African American): >60
EGFR (Non-African Amer.): >60
Glucose: 100 mg/dL — ABNORMAL HIGH (ref 65–99)
Osmolality: 279 (ref 275–301)
Potassium: 3.4 mmol/L — ABNORMAL LOW (ref 3.5–5.1)
SGOT(AST): 21 U/L (ref 15–37)
SGPT (ALT): 21 U/L
Sodium: 140 mmol/L (ref 136–145)
Total Protein: 7.5 g/dL (ref 6.4–8.2)

## 2014-06-25 LAB — URINALYSIS, COMPLETE
Bilirubin,UR: NEGATIVE
Blood: NEGATIVE
Glucose,UR: NEGATIVE mg/dL (ref 0–75)
Ketone: NEGATIVE
Leukocyte Esterase: NEGATIVE
Nitrite: NEGATIVE
Ph: 6 (ref 4.5–8.0)
Protein: NEGATIVE
RBC,UR: 12 /HPF (ref 0–5)
Specific Gravity: 1.008 (ref 1.003–1.030)
Squamous Epithelial: <1
WBC UR: 7 /HPF (ref 0–5)

## 2014-08-28 ENCOUNTER — Other Ambulatory Visit (HOSPITAL_COMMUNITY): Payer: Self-pay | Admitting: Specialist

## 2014-08-28 ENCOUNTER — Ambulatory Visit: Payer: Self-pay | Admitting: Specialist

## 2014-08-28 DIAGNOSIS — N971 Female infertility of tubal origin: Secondary | ICD-10-CM

## 2014-08-31 ENCOUNTER — Ambulatory Visit (HOSPITAL_COMMUNITY)
Admission: RE | Admit: 2014-08-31 | Discharge: 2014-08-31 | Disposition: A | Discharge status: Home / Self Care | Payer: BC Managed Care – PPO | Source: Ambulatory Visit | Attending: Specialist | Admitting: Specialist

## 2014-08-31 DIAGNOSIS — N979 Female infertility, unspecified: Secondary | ICD-10-CM | POA: Insufficient documentation

## 2014-08-31 DIAGNOSIS — N971 Female infertility of tubal origin: Secondary | ICD-10-CM

## 2014-08-31 MED ORDER — IOHEXOL 300 MG/ML  SOLN
20.0000 mL | Freq: Once | INTRAMUSCULAR | Status: AC | PRN
  Administered 2014-08-31: 11 mL

## 2014-09-25 ENCOUNTER — Encounter: Payer: Self-pay | Admitting: Internal Medicine

## 2014-09-25 ENCOUNTER — Ambulatory Visit (INDEPENDENT_AMBULATORY_CARE_PROVIDER_SITE_OTHER): Payer: BC Managed Care – PPO | Admitting: Internal Medicine

## 2014-09-25 ENCOUNTER — Ambulatory Visit: Payer: Self-pay | Admitting: Internal Medicine

## 2014-09-25 VITALS — BP 110/70 | HR 60 | Temp 97.9°F | Wt 134.0 lb

## 2014-09-25 DIAGNOSIS — R109 Unspecified abdominal pain: Secondary | ICD-10-CM | POA: Insufficient documentation

## 2014-09-25 LAB — RENAL FUNCTION PANEL
Albumin: 4.1 g/dL (ref 3.5–5.2)
BUN: 11 mg/dL (ref 6–23)
CO2: 25 mEq/L (ref 19–32)
Calcium: 9.9 mg/dL (ref 8.4–10.5)
Chloride: 102 mEq/L (ref 96–112)
Creatinine, Ser: 0.8 mg/dL (ref 0.4–1.2)
GFR: 93.96 mL/min (ref >60.00)
Glucose, Bld: 91 mg/dL (ref 70–99)
Phosphorus: 3.4 mg/dL (ref 2.3–4.6)
Potassium: 3.8 mEq/L (ref 3.5–5.1)
Sodium: 136 mEq/L (ref 135–145)

## 2014-09-25 LAB — POCT URINALYSIS DIPSTICK
Bilirubin, UA: NEGATIVE
Blood, UA: NEGATIVE
Glucose, UA: NEGATIVE
Ketones, UA: NEGATIVE
Leukocytes, UA: NEGATIVE
Nitrite, UA: NEGATIVE
Protein, UA: NEGATIVE
Spec Grav, UA: 1.01
Urobilinogen, UA: NEGATIVE
pH, UA: 6

## 2014-09-25 LAB — CBC WITH DIFFERENTIAL/PLATELET
Basophils Absolute: 0 10*3/uL (ref 0.0–0.1)
Basophils Relative: 0.3 % (ref 0.0–3.0)
Eosinophils Absolute: 0.3 10*3/uL (ref 0.0–0.7)
Eosinophils Relative: 4.1 % (ref 0.0–5.0)
HCT: 43.6 % (ref 36.0–46.0)
Hemoglobin: 14.4 g/dL (ref 12.0–15.0)
Lymphocytes Relative: 28.1 % (ref 12.0–46.0)
Lymphs Abs: 1.9 10*3/uL (ref 0.7–4.0)
MCHC: 33 g/dL (ref 30.0–36.0)
MCV: 88.1 fl (ref 78.0–100.0)
Monocytes Absolute: 0.4 10*3/uL (ref 0.1–1.0)
Monocytes Relative: 5.7 % (ref 3.0–12.0)
Neutro Abs: 4.1 10*3/uL (ref 1.4–7.7)
Neutrophils Relative %: 61.8 % (ref 43.0–77.0)
Platelets: 252 10*3/uL (ref 150.0–400.0)
RBC: 4.95 Mil/uL (ref 3.87–5.11)
RDW: 13.6 % (ref 11.5–15.5)
WBC: 6.7 10*3/uL (ref 4.0–10.5)

## 2014-09-25 NOTE — Assessment & Plan Note (Addendum)
Doesn't sound muscular or GI Abdominal tenderness may be related to recent period--- may not be related No signs of infection--urinalysis benign No symptoms of stone-- the recurrent hydronephrosis with pregnancy suggests ureteral abnormality and her discomfort is reminiscent of her hydronephrosis  Will check CBC and renal Renal ultrasound If hydronephrosis-will need urology eval

## 2014-09-25 NOTE — Progress Notes (Signed)
   Subjective:    Patient ID: Judith FoleyAlyssa Krivac, female    DOB: 11/01/1986, 28 y.o.   MRN: [id number]  HPI Having some right low back pain Over a week Reminds her of feeling when she had hydronephrosis during pregnancy Dull ache  No kidney stones No hematuria, dysuria, etc  No new tasks or apparent injury Doesn't feel muscular No change with positional changes, lifting, etc  Just finished period--have been regular Had tubal reversed--trying to conceive  Current Outpatient Prescriptions on File Prior to Visit  Medication Sig Dispense Refill  . clonazePAM (KLONOPIN) 0.5 MG tablet Take 1 tablet (0.5 mg total) by mouth 2 (two) times daily as needed for anxiety. 50 tablet 0  . sertraline (ZOLOFT) 50 MG tablet Take 1 tablet (50 mg total) by mouth daily. 30 tablet 3   No current facility-administered medications on file prior to visit.    No Known Allergies  Past Medical History  Diagnosis Date  . Headache(784.0)     frequent  . History of kidney stones 2013  . Hx of migraines   . History of pyelonephritis 2012    Past Surgical History  Procedure Laterality Date  . Tonsillectomy  1994  . Breast enhancement surgery  2012  . Tubal ligation  10/13    Family History  Problem Relation Age of Onset  . Hypertension Father   . CAD Paternal Grandfather   . Parkinson's disease Paternal Grandfather   . Cancer Paternal Grandfather     leukemia  . Cancer Sister 4    tumor in leg  . Cancer Other     maternal great grandmother; breast  . Migraines Father   . Hyperlipidemia Father   . Stroke Paternal Grandfather   . Diabetes Paternal Grandmother   . Fibromyalgia Mother     History   Social History  . Marital Status: Married    Spouse Name: N/A    Number of Children: N/A  . Years of Education: N/A   Occupational History  . Not on file.   Social History Main Topics  . Smoking status: Never Smoker   . Smokeless tobacco: Never Used  . Alcohol Use: Yes     Comment:  Occasional  . Drug Use: No  . Sexual Activity:    Partners: Male   Other Topics Concern  . Not on file   Social History Narrative   Caffeine: cut back   Lives with husband and 5 children, 1 dog   Occupation: stay at home mom   Edu: HS   Activity: exercises at home, walks dog   Diet: good water, fruits/vegetables daily, red meat 2x/wk, fish 2x/wk   Review of Systems No fever No vomiting or diarrhea Appetite is fine    Objective:   Physical Exam  Constitutional: She appears well-developed and well-nourished. No distress.  Neck: Normal range of motion.  Pulmonary/Chest: Effort normal and breath sounds normal. No respiratory distress. She has no wheezes. She has no rales.  Abdominal:  Mild RLQ tenderness with slight radiation to left  Musculoskeletal:  No spine tenderness No CVA tenderness Normal back flexion and twisting without symptoms  Lymphadenopathy:    She has no cervical adenopathy.          Assessment & Plan:

## 2014-09-25 NOTE — Progress Notes (Signed)
Pre visit review using our clinic review tool, if applicable. No additional management support is needed unless otherwise documented below in the visit note. 

## 2014-10-05 ENCOUNTER — Encounter: Payer: Self-pay | Admitting: Internal Medicine

## 2014-10-05 ENCOUNTER — Ambulatory Visit (INDEPENDENT_AMBULATORY_CARE_PROVIDER_SITE_OTHER): Payer: BC Managed Care – PPO | Admitting: Internal Medicine

## 2014-10-05 VITALS — BP 120/80 | HR 92 | Temp 98.3°F | Wt 133.0 lb

## 2014-10-05 DIAGNOSIS — F4322 Adjustment disorder with anxiety: Secondary | ICD-10-CM

## 2014-10-05 NOTE — Assessment & Plan Note (Addendum)
Worsening stress Doesn't sound like she had real marriage Discussed changing locks and seeing attorney She is going back to counselor in 2 days Will hold off on meds since she is off SSRI--not sure this will help now 30 minutes counseling and planning

## 2014-10-05 NOTE — Patient Instructions (Signed)
Please change your locks and see an attorney. Let me know if you feel worse. Keep the appointment with the counselor.
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
## 2014-10-05 NOTE — Progress Notes (Signed)
Pre visit review using our clinic review tool, if applicable. No additional management support is needed unless otherwise documented below in the visit note. 

## 2014-10-14 ENCOUNTER — Ambulatory Visit (INDEPENDENT_AMBULATORY_CARE_PROVIDER_SITE_OTHER): Payer: BC Managed Care – PPO | Admitting: Internal Medicine

## 2014-10-14 ENCOUNTER — Encounter: Payer: Self-pay | Admitting: Internal Medicine

## 2014-10-14 VITALS — BP 120/80 | HR 87 | Temp 98.6°F | Wt 130.0 lb

## 2014-10-14 DIAGNOSIS — F321 Major depressive disorder, single episode, moderate: Secondary | ICD-10-CM

## 2014-10-14 DIAGNOSIS — F329 Major depressive disorder, single episode, unspecified: Secondary | ICD-10-CM | POA: Insufficient documentation

## 2014-10-14 MED ORDER — SERTRALINE HCL 50 MG PO TABS: 50.0000 mg | ORAL_TABLET | Freq: Every day | ORAL | Status: DC
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
## 2014-10-14 NOTE — Progress Notes (Signed)
Pre visit review using our clinic review tool, if applicable. No additional management support is needed unless otherwise documented below in the visit note. 

## 2014-10-14 NOTE — Assessment & Plan Note (Signed)
Now several weeks of daily symptoms with eating, sleeping, etc problems Will restart the sertraline at 50mg  She is continuing the counseling Using the clonazepam but not helping Discussed proceeding with the legal separation

## 2014-10-14 NOTE — Patient Instructions (Signed)
Please cut the first 2 sertraline in half, and take just 25mg  daily for 4 days. Then increase to the full tablet daily. Let me know if you have any problems. Stop the med and let me know if you have any bad thoughts or consider suicide.

## 2014-10-16 ENCOUNTER — Ambulatory Visit: Payer: BC Managed Care – PPO | Admitting: Family Medicine

## 2014-10-19 ENCOUNTER — Other Ambulatory Visit: Payer: Self-pay

## 2014-10-19 NOTE — Telephone Encounter (Signed)
Pt saw Dr Alphonsus SiasLetvak 10/14/14 and request refill for klonopin to go to Dr Alphonsus SiasLetvak.Please advise.[place].

## 2014-10-19 NOTE — Telephone Encounter (Signed)
Approved:#50 x 0

## 2014-10-20 MED ORDER — CLONAZEPAM 0.5 MG PO TABS: 0.5000 mg | ORAL_TABLET | Freq: Two times a day (BID) | ORAL | Status: DC | PRN

## 2014-10-20 NOTE — Telephone Encounter (Signed)
rx called into pharmacy

## 2014-11-04 ENCOUNTER — Telehealth: Payer: Self-pay | Admitting: Family Medicine

## 2014-11-04 ENCOUNTER — Ambulatory Visit: Payer: BC Managed Care – PPO | Admitting: Internal Medicine

## 2014-11-04 NOTE — Telephone Encounter (Signed)
Patient did not come for their scheduled appointment today for 3 week follow up.  Please let me know if the patient needs to be contacted immediately for follow up or if no follow up is necessary.

## 2014-11-04 NOTE — Telephone Encounter (Signed)
Tried calling pt no answer

## 2014-11-04 NOTE — Telephone Encounter (Signed)
Please contact her for follow up as soon as feasible

## 2014-11-09 ENCOUNTER — Emergency Department: Payer: Self-pay | Admitting: Emergency Medicine

## 2014-11-09 ENCOUNTER — Telehealth: Payer: Self-pay | Admitting: Family Medicine

## 2014-11-09 NOTE — Telephone Encounter (Signed)
thats fine. thanks

## 2014-11-09 NOTE — Telephone Encounter (Signed)
Change provider

## 2014-11-09 NOTE — Telephone Encounter (Signed)
Pt would like to transfer care from Dr Reece AgarG to Dr Alphonsus SiasLetvak Is this ok??

## 2014-11-09 NOTE — Telephone Encounter (Signed)
Appointment 12/29 pt aware

## 2014-11-09 NOTE — Telephone Encounter (Signed)
I have already been seeing her lately  She missed her last appt---please have her reschedule

## 2014-11-10 ENCOUNTER — Ambulatory Visit (INDEPENDENT_AMBULATORY_CARE_PROVIDER_SITE_OTHER): Payer: BC Managed Care – PPO | Admitting: Internal Medicine

## 2014-11-10 ENCOUNTER — Encounter: Payer: Self-pay | Admitting: Internal Medicine

## 2014-11-10 VITALS — BP 110/70 | HR 95 | Temp 98.6°F | Wt 126.0 lb

## 2014-11-10 DIAGNOSIS — F320 Major depressive disorder, single episode, mild: Secondary | ICD-10-CM

## 2014-11-10 DIAGNOSIS — M533 Sacrococcygeal disorders, not elsewhere classified: Secondary | ICD-10-CM

## 2014-11-10 DIAGNOSIS — J029 Acute pharyngitis, unspecified: Secondary | ICD-10-CM | POA: Insufficient documentation

## 2014-11-10 MED ORDER — OXYCODONE-ACETAMINOPHEN 5-325 MG PO TABS: 1.0000 | ORAL_TABLET | Freq: Every evening | ORAL | Status: DC | PRN

## 2014-11-10 MED ORDER — ONDANSETRON HCL 4 MG PO TABS: 4.0000 mg | ORAL_TABLET | Freq: Three times a day (TID) | ORAL | Status: DC | PRN

## 2014-11-10 NOTE — Assessment & Plan Note (Signed)
Seems to be self limited viral infection No Rx except analgesics

## 2014-11-10 NOTE — Progress Notes (Signed)
Pre visit review using our clinic review tool, if applicable. No additional management support is needed unless otherwise documented below in the visit note. 

## 2014-11-10 NOTE — Assessment & Plan Note (Signed)
Severe but x-ray negative in ER Will give percocet with zofran for nights
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
## 2014-11-10 NOTE — Assessment & Plan Note (Signed)
Clearly better now Didn't tolerate the medication May have been more reactive---will just observe without the meds and continue counseling

## 2014-11-13 HISTORY — PX: OTHER SURGICAL HISTORY: SHX169

## 2015-01-13 ENCOUNTER — Telehealth: Payer: Self-pay

## 2015-01-13 ENCOUNTER — Ambulatory Visit: Payer: BC Managed Care – PPO | Admitting: Internal Medicine

## 2015-01-13 NOTE — Telephone Encounter (Signed)
Left message for patient to call office to schedule follow up appointment and to let us know how she is doing.

## 2015-02-10 ENCOUNTER — Ambulatory Visit (INDEPENDENT_AMBULATORY_CARE_PROVIDER_SITE_OTHER): Payer: 59 | Admitting: Family Medicine

## 2015-02-10 ENCOUNTER — Encounter: Payer: Self-pay | Admitting: Family Medicine

## 2015-02-10 VITALS — BP 104/74 | HR 68 | Temp 98.3°F | Ht 67.0 in | Wt 127.5 lb

## 2015-02-10 DIAGNOSIS — N912 Amenorrhea, unspecified: Secondary | ICD-10-CM | POA: Diagnosis not present

## 2015-02-10 DIAGNOSIS — R51 Headache: Secondary | ICD-10-CM

## 2015-02-10 DIAGNOSIS — R519 Headache, unspecified: Secondary | ICD-10-CM

## 2015-02-10 DIAGNOSIS — R5383 Other fatigue: Secondary | ICD-10-CM

## 2015-02-10 LAB — BASIC METABOLIC PANEL
BUN: 9 mg/dL (ref 6–23)
CO2: 30 mEq/L (ref 19–32)
Calcium: 10 mg/dL (ref 8.4–10.5)
Chloride: 104 mEq/L (ref 96–112)
Creatinine, Ser: 0.67 mg/dL (ref 0.40–1.20)
GFR: 111.67 mL/min (ref >60.00)
Glucose, Bld: 85 mg/dL (ref 70–99)
Potassium: 3.8 mEq/L (ref 3.5–5.1)
Sodium: 137 mEq/L (ref 135–145)

## 2015-02-10 LAB — CBC WITH DIFFERENTIAL/PLATELET
Basophils Absolute: 0 10*3/uL (ref 0.0–0.1)
Basophils Relative: 0.8 % (ref 0.0–3.0)
Eosinophils Absolute: 0.2 10*3/uL (ref 0.0–0.7)
Eosinophils Relative: 4.9 % (ref 0.0–5.0)
HCT: 39.1 % (ref 36.0–46.0)
Hemoglobin: 13.1 g/dL (ref 12.0–15.0)
Lymphocytes Relative: 32.3 % (ref 12.0–46.0)
Lymphs Abs: 1.6 10*3/uL (ref 0.7–4.0)
MCHC: 33.5 g/dL (ref 30.0–36.0)
MCV: 87.6 fl (ref 78.0–100.0)
Monocytes Absolute: 0.2 10*3/uL (ref 0.1–1.0)
Monocytes Relative: 4.6 % (ref 3.0–12.0)
Neutro Abs: 2.8 10*3/uL (ref 1.4–7.7)
Neutrophils Relative %: 57.4 % (ref 43.0–77.0)
Platelets: 241 10*3/uL (ref 150.0–400.0)
RBC: 4.46 Mil/uL (ref 3.87–5.11)
RDW: 12.8 % (ref 11.5–15.5)
WBC: 5 10*3/uL (ref 4.0–10.5)

## 2015-02-10 LAB — HEPATIC FUNCTION PANEL
ALT: 16 U/L (ref 0–35)
AST: 17 U/L (ref 0–37)
Albumin: 4.4 g/dL (ref 3.5–5.2)
Alkaline Phosphatase: 41 U/L (ref 39–117)
Bilirubin, Direct: 0.1 mg/dL (ref 0.0–0.3)
Total Bilirubin: 0.4 mg/dL (ref 0.2–1.2)
Total Protein: 7.4 g/dL (ref 6.0–8.3)

## 2015-02-10 LAB — POCT URINE PREGNANCY: Preg Test, Ur: NEGATIVE

## 2015-02-10 LAB — TSH: TSH: 1.23 u[IU]/mL (ref 0.35–4.50)

## 2015-02-10 MED ORDER — CLONAZEPAM 0.5 MG PO TABS: 0.5000 mg | ORAL_TABLET | Freq: Two times a day (BID) | ORAL | Status: DC | PRN

## 2015-02-10 MED ORDER — BUTALBITAL-ASPIRIN-CAFFEINE 50-325-40 MG PO CAPS: 1.0000 | ORAL_CAPSULE | Freq: Four times a day (QID) | ORAL | Status: DC | PRN

## 2015-02-10 NOTE — Progress Notes (Signed)
Dr. Karleen HampshireSpencer T. Erasmo Vertz, MD, CAQ Sports [facility] [address] [place], [address] Phone: [phone] Fax: [phone]  02/10/2015  Patient: Judith Martinez, MRN: [id number], DOB: 12/16/1985, 29 y.o.  Primary Physician:  Tillman Abideichard Letvak, MD  Chief Complaint: Headache and Sleepy  Subjective:   Judith Martinez is a 29 y.o. very pleasant female patient who presents with the following:  Really bad headache for several weeks. Goes away with sleeping and tired. Had some migraines with her pregnancy. Other than this, she does not have a history of migraines at all.  Mom and Dad with migraines.   Going to get some glasses - 0.75, 0.25. No sinus issues.   LMP 01/06/2015 Trying to get pregnant. She did have a positive pregnancy test last month, but ultimately she had a very heavy menses and flow for about 10 days.  Hit left eye last weekend. Felt a little off balance. Had headache before then, though. Has also been feeling fatigued.  Past Medical History, Surgical History, Social History, Family History, Problem List, Medications, and Allergies have been reviewed and updated if relevant.  ROS: GEN: Acute illness details above GI: Tolerating PO intake GU: maintaining adequate hydration and urination Pulm: No SOB Interactive and getting along well at home.  Otherwise, ROS is as per the HPI.   Objective:   BP 104/74 mmHg  Pulse 68  Temp(Src) 98.3 F (36.8 C) (Oral)  Ht 5\' 7"  (1.702 m)  Wt 127 lb 8 oz (57.834 kg)  BMI 19.96 kg/m2  LMP 01/06/2015   GEN: WDWN, NAD, Non-toxic, A & O x 3 HEENT: Atraumatic, Normocephalic. Neck supple. No masses, No LAD. Ears and Nose: No external deformity. CV: RRR, No M/G/R. No JVD. No thrill. No extra heart sounds. PULM: CTA B, no wheezes, crackles, rhonchi. No retractions. No resp. distress. No accessory muscle use. ABD: S, NT, ND, +BS. No rebound tenderness. No HSM.  EXTR: No c/c/e  Neuro: CN 2-12 grossly  intact. PERRLA. EOMI. Sensation intact throughout. Str 5/5 all extremities. DTR 2+. No clonus. A and o x 4. Romberg neg. Finger nose neg. Heel -shin neg.   PSYCH: Normally interactive. Conversant. Not depressed or anxious appearing.  Calm demeanor.      Laboratory and Imaging Data: Results for orders placed or performed in visit on 02/10/15  Basic metabolic panel  Result Value Ref Range   Sodium 137 135 - 145 mEq/L   Potassium 3.8 3.5 - 5.1 mEq/L   Chloride 104 96 - 112 mEq/L   CO2 30 19 - 32 mEq/L   Glucose, Bld 85 70 - 99 mg/dL   BUN 9 6 - 23 mg/dL   Creatinine, Ser 1.320.67 0.40 - 1.20 mg/dL   Calcium 44.010.0 8.4 - 10.210.5 mg/dL   GFR 725.36111.67 >64.40>60.00 mL/min  CBC with Differential/Platelet  Result Value Ref Range   WBC 5.0 4.0 - 10.5 K/uL   RBC 4.46 3.87 - 5.11 Mil/uL   Hemoglobin 13.1 12.0 - 15.0 g/dL   HCT 34.739.1 42.536.0 - 95.646.0 %   MCV 87.6 78.0 - 100.0 fl   MCHC 33.5 30.0 - 36.0 g/dL   RDW 38.712.8 56.411.5 - 33.215.5 %   Platelets 241.0 150.0 - 400.0 K/uL   Neutrophils Relative % 57.4 43.0 - 77.0 %   Lymphocytes Relative 32.3 12.0 - 46.0 %   Monocytes Relative 4.6 3.0 - 12.0 %   Eosinophils Relative 4.9 0.0 - 5.0 %   Basophils Relative 0.8 0.0 -  3.0 %   Neutro Abs 2.8 1.4 - 7.7 K/uL   Lymphs Abs 1.6 0.7 - 4.0 K/uL   Monocytes Absolute 0.2 0.1 - 1.0 K/uL   Eosinophils Absolute 0.2 0.0 - 0.7 K/uL   Basophils Absolute 0.0 0.0 - 0.1 K/uL  Hepatic function panel  Result Value Ref Range   Total Bilirubin 0.4 0.2 - 1.2 mg/dL   Bilirubin, Direct 0.1 0.0 - 0.3 mg/dL   Alkaline Phosphatase 41 39 - 117 U/L   AST 17 0 - 37 U/L   ALT 16 0 - 35 U/L   Total Protein 7.4 6.0 - 8.3 g/dL   Albumin 4.4 3.5 - 5.2 g/dL  TSH  Result Value Ref Range   TSH 1.23 0.35 - 4.50 uIU/mL  hCG, serum, qualitative  Result Value Ref Range   Preg, Serum NEG   POCT urine pregnancy  Result Value Ref Range   Preg Test, Ur Negative      Assessment and Plan:   Nonintractable episodic headache, unspecified headache  type  Amenorrhea - Plan: POCT urine pregnancy, hCG, serum, qualitative  Other fatigue - Plan: Basic metabolic panel, CBC with Differential/Platelet, Hepatic function panel, TSH, hCG, serum, qualitative  This may be a migraine variant. Her laboratories are all normal. Normal neurological exam.  I discussed with the patient that she recently had had a miscarriage, and it seems that she did not fully understand this. She is not having any abdominal pain or fever. She does not think that she is depressed at all.  For now, we are going to start with some Fioricet and see if this improves her headaches. Recommended sleep.  Follow-up: No Follow-up on file.  New Prescriptions   BUTALBITAL-ASPIRIN-CAFFEINE (FIORINAL) 50-325-40 MG PER CAPSULE    Take 1 capsule by mouth every 6 (six) hours as needed for headache.   Orders Placed This Encounter  Procedures  . Basic metabolic panel  . CBC with Differential/Platelet  . Hepatic function panel  . TSH  . hCG, serum, qualitative  . POCT urine pregnancy    Signed,  Amarionna Arca T. Illyria Sobocinski, MD   Patient's Medications  New Prescriptions   BUTALBITAL-ASPIRIN-CAFFEINE (FIORINAL) 50-325-40 MG PER CAPSULE    Take 1 capsule by mouth every 6 (six) hours as needed for headache.  Previous Medications   No medications on file  Modified Medications   Modified Medication Previous Medication   CLONAZEPAM (KLONOPIN) 0.5 MG TABLET clonazePAM (KLONOPIN) 0.5 MG tablet      Take 1 tablet (0.5 mg total) by mouth 2 (two) times daily as needed for anxiety.    Take 1 tablet (0.5 mg total) by mouth 2 (two) times daily as needed for anxiety.  Discontinued Medications   ONDANSETRON (ZOFRAN) 4 MG TABLET    Take 1 tablet (4 mg total) by mouth every 8 (eight) hours as needed for nausea or vomiting.   OXYCODONE-ACETAMINOPHEN (PERCOCET) 5-325 MG PER TABLET    Take 1 tablet by mouth at bedtime as needed for severe pain.

## 2015-02-10 NOTE — Progress Notes (Signed)
Pre visit review using our clinic review tool, if applicable. No additional management support is needed unless otherwise documented below in the visit note. 

## 2015-02-11 LAB — HCG, SERUM, QUALITATIVE: Preg, Serum: NEGATIVE

## 2015-03-02 NOTE — Op Note (Signed)
PATIENT NAME:  Judith Martinez, Judith MR#:  [id number] DATE OF BIRTH:  03/17/1986  DATE OF PROCEDURE:  09/11/2012  PREOPERATIVE DIAGNOSIS: Postpartum, multiparous female desiring permanent sterilization.   POSTOPERATIVE DIAGNOSIS: Female sterilized.   PROCEDURE: Postpartum bilateral tubal ligation.   SURGEON: Elliot Gurneyarrie C. Edwardine Deschepper, M.D.   ESTIMATED BLOOD LOSS: Approximately 50 mL.   FINDINGS: Bilateral tubal ostia seen after excision of two normal postpartum fundus.   DESCRIPTION OF PROCEDURE: The patient was taken to the Operating Room and placed in supine position. After adequate general endotracheal anesthesia was instilled, the patient was prepped and draped in the usual sterile fashion. Umbilicus was injected with Marcaine. An incision was made within the umbilical fold. This was carried sharply down to fascia. The fascia was grasped with Allis clamp and elevated. The fascia was then nicked in the midline. The incision was extended with a Metzenbaum scissor. The edges of the peritoneum were then grasped with the Allis clamps. The patient was placed in Trendelenburg. First the left tube was grasped with the Babcock clamp. The tube was carried out to the fimbria and carried back to the mid ampullary portion where a hole was burned into the peritoneum with the Bovie. Two pieces of plain gut suture were passed through the hole. A 2 cm portion of tube was tied off. The tube was then excised. Good telescoping was seen. Good hemostasis was identified.   Attention was then turned to the right side where the tube was grasped and carried out to the fimbriated end and then walked back to the mid ampullary portion. Divot was made in the peritoneum with the Bovie. Two pieces of plain gut suture were then placed through this opening. A 2 cm approximate portion of tube was tied off. The tubal portion was excised with the Metzenbaum scissor. Good tubal telescoping was seen. Good hemostasis was seen. The tube was allowed to  fall back into the abdomen. The other tube was identified for hemostasis. Good hemostasis was seen.   Marcaine was injected around the tubes and in the belly. The fascia was closed with UR-6 on a Vicryl suture for deep fascial stitch and 4-0 Monocryl was used in the inverted fashion at the umbilicus. Dermabond was placed, Steri-Strips were placed, and a bandage was placed. The patient was then taken to recovery after having tolerated the procedure well. ____________________________ Elliot Gurneyarrie C. Trentin Knappenberger, MD cck:slb D: 09/15/2012 23:46:25 ET T: 09/16/2012 10:11:42 ET JOB#: [id number]  cc: Elliot Gurneyarrie C. Maricella Filyaw, MD, <Dictator> Elliot GurneyARRIE C Coree Riester MD ELECTRONICALLY SIGNED 09/19/2012 12:28

## 2015-03-02 NOTE — Consult Note (Signed)
Referral Information:   Reason for Referral hydronephrosis pain at 36-37 weeks Missing IUD  Rh negative    Referring Physician Dr Tretha Sciara OB    Prenatal Hx 29 yo G5 p4004 with EDC of 11/15 based on LMP2/8/13 Pt conceived while using an IUD - 9w 5d scan on 03/01/12 did not identify location pt reports that her last 4 pregnancies were all induced at  37-38 weeks due to severe colicky flank pain believed to be due to hydronephrosis. She notes that this pain typically begins inthe early third trimeste- this pregnancy was a  little later than ussual . No state of hydration or change in position releives it , imaging has not revealed a stone just hydronephrosis. She has had occasional UTI that has been rx no fever. She reports since the pain began she can only sleep for short periods and requires tyl #3 to rest usually about 4 /day. When severe she has come to the hospital and required morphine for relief. She notes she dislikes using meds and the way it makes her feel - she does nto require pain meds outside of pregnancy .She is concerned that she is not eating well due to pain and has not gained weight since August.    Past Obstetrical Hx 2005 38 wks [facility] female 2008 37w [facility] female 2010 37 5/7 [facility] female  2011 50-38 [facility] female- initial amnio immature spontaneous labor occurred shortly afterwards [place] was healthy- had IUD placed pp requested BTL inthat pregnancy   Home Medications: Medication Instructions Status  Zofran 4 mg oral tablet 1 tab(s) orally every 8 hours Active  Prenatal Multivitamins oral tablet 1 tab(s) orally once a day Active  Tylenol with Codeine #3 oral tablet 1 tab(s) orally every 8 hours, As Needed Active   Allergies:   No Known Allergies:   Vital Signs/Notes:  Nursing Vital Signs: **Vital Signs.:   24-Oct-13 09:04   Vital Signs Type Routine   Temperature Temperature (F) 98.1   Celsius 36.7   Temperature Source oral   Pulse Pulse 105   Pulse source if not  from Vital Sign Device dinamap   Respirations Respirations 14   Systolic BP Systolic BP 113   Diastolic BP (mmHg) Diastolic BP (mmHg) 74   Mean BP 87   BP Source  if not from Vital Sign Device dinamap   Perinatal Consult:   LMP 22-Dec-2011    PSurg Hx T&A     Additional Lab/Radiology Notes renal u/s 2011 at [facility] suggested right hydronephrosis is greater than expected for pregnancy alone and the discrepancy between sides was more pronounced than usual- a question of a partial obstruction was raised.   Impression/Recommendations:   Impression IUP at 36/6 by LMP early u/s  1 Hydronephrosis pain - consistent in all 5 pregnancies - 2011 scan suggested a realtive obstruction onthe right . Narcotic requiring,  2 Poor weight gain - pt has laost 4 pounds and no weight gain since august - she attributes to her pain. 3 Prodromal labor - pt is 4 cm today with small amount of bloody show prior to exam  4 Lost IUD- placed 09/[facility] at [facility] appears to be paragard thoguh notes are not clear, not seen on early scan , Dr Tiburcio Pea is considering KUB prior to labor to allow localization if abdominal and cesarean necessary 5 Rh neg 6 Desires BTL    Recommendations I reviewed with the pt current recommendations from ACOG regarding delivery prior to 39 weeks and the concept  of early term babies having higher rates of morbidity. I told her that ACOG also suggests that a mature amnio cannot predict /avoid complications of being early term . Maternal discomfort is specifically listed as not an indication for early delivery. On the other hand - her renal pain is not typical pregnancy discomfort. Her pain is recurrent and specific. The [facility] ultrasound in 2011 suggests partial  obstruction on the right kidney. The pt does not have any h/o pain or narcotic  use outside of late pregnancy. Her lack of weight gain over the past 2 months suggests her discomfort is significant.While her renal issues are likely reversible , I  don't think the ACOG bulletin specifically addresses her issue. I think her primary provider is best suited to assist her in balancing the risks to the fetus of early term delivery versus her weight loss and narcotic exposure for the fetus and theoretical risk to her kidney. An amniocentesis might be somewhat helpful in this decision process though like last delivery she might deliver despite an immature amnio. Continuing to offer narcotics seems compassionate in light of her renal colic. The mother listened to my concerns for early term infant outcomes with an immature amnio - I think she coudl be motivated to tolerate this discomfort further. Check urine culture if not doen recently.  Plan to r/o intrabdominal IUD seems reasonable though 2011 placement note appears straightforward and with a Para 4 cervix expulsion seems most likely cause of absent IUD . Despite her young age - pt has been clear onher desire to be sterilized.   Plan:   Delivery Mode Vaginal    Comment/Plan I think amnio and induction at 37  is reasonable with her degree of pathology. pt has been informed of risks to fetus of early term delivery.     Total Time Spent with Patient 30 minutes    >50% of visit spent in couseling/coordination of care yes    Office Use Only 99242  Level 2 (30min) NEW office consult exp prob focused   Coding Description: MATERNAL CONDITIONS/HISTORY INDICATION(S).   OTHER: renal colic.  Electronic Signatures: Rondall AllegraLivingston, Simrin Vegh Gresham (MD)  (Signed 24-Oct-13 13:59)  Authored: Referral, Home Medications, Allergies, Vital Signs/Notes, Consult, Lab/Radiology Notes, Impression, Plan, Billing, Coding Description   Last Updated: 24-Oct-13 13:59 by Rondall AllegraLivingston, Brandis Wixted Gresham (MD)

## 2015-03-23 NOTE — H&P (Signed)
L&D Evaluation:  History:   HPI 29 year old G5P4 presents to L&D early this AM for scheduled amniocentesis that was performed by Dr. Leatha GildingLivingston (from [facility]). She is 37 weeks 2 days with EDD 09/27/12. She conceived with Paraguard IUD, no IUD was seen at 9 week U/S.  Pt has long hx of renal colic with prior pregnancies and has been induced with 3 of 4 previous deliveries between 37 and 38 weeks other due to pain and hydronephrosis.  [facility] and will procede with IOL. Also had KUB today to try to localize IUD but no IUD seen on KUB. Labs: O Negative (Rhogam given during pregnancy and today after amnio) RI, VI, All other labs WNL GBS POSITIVE    Presents with other, for amniocentesis, now IOL    Patient's Medical History UTI in past, renal colic    Patient's Surgical History T&A    Medications Pre Natal Vitamins    Allergies NKDA    Social History none    Family History Non-Contributory   ROS:   ROS All systems were reviewed.  HEENT, CNS, GI, GU, Respiratory, CV, Renal and Musculoskeletal systems were found to be normal.   Exam:   Vital Signs stable    Urine Protein not completed    General no apparent distress    Mental Status clear    Chest clear    Abdomen gravid, non-tender    Estimated Fetal Weight Average for gestational age    Edema no edema    Pelvic 3-4/50/-1    Mebranes Intact    FHT normal rate with no decels    Ucx irregular   Impression:   Impression IOL due to renal colic, fetal lung maturity documented   Plan:   Plan EFM/NST, antibiotics for GBBS prophylaxis    Comments will allow to eat and drink before induction started Needs abx for GBS Labor orders in   Electronic Signatures: Shella MaximPutnam, Rebeca Valdivia (CNM)  (Signed 28-Oct-13 15:42)  Authored: L&D Evaluation   Last Updated: 28-Oct-13 15:42 by Shella MaximPutnam, Pernell Lenoir (CNM)

## 2015-03-23 NOTE — H&P (Signed)
L&D Evaluation:  History Expanded:   HPI 29 yo G5P4004 at 6326 5/7 weeks with EDD of 09/27/12 per LMP and 9 week US. [facility], conception with Paragard IUD (US has not been able to locate IUD), Renal Colic with pending renal US. (Prenatal records not available.) Pt presents with c/o SOB and DOE while walking up stairs, yellow vaginal discharge, right flank pain and low abdominal pain.    Blood Type (Maternal) RH negative    Group B Strep Results Maternal (Result >5wks must be treated as unknown) unknown/result > 5 weeks ago    Patient's Medical History Nephrolithiasis with prior pregnancies    Patient's Surgical History Tonsillectomy & Adenoidectomy    Medications Pre Natal Vitamins    Allergies Ibuprofen    Social History none   ROS:   ROS see HPI   Exam:   Vital Signs stable    Urine Protein not completed    General no apparent distress    Mental Status clear    Chest clear    Heart no murmur/gallop/rubs    Abdomen gravid, non-tender    Back CVAT, Right CVAT, negative on left    Pelvic no external lesions, cervix closed and thick, yellow, curd-like discharge, wet mount + yeast    Mebranes Intact    FHT appropriate for gestational age    Ucx absent    Skin dry   Impression:   Impression IUP at 26 5/7 weeks with SOB, DOE, rt flank pain and lower abdominal pain   Plan:   Comments Renal US Rx for Diflucan po  Will likely send to ED for evaluation of SOB/DOE   Electronic Signatures: Vella KohlerBrothers, Darreld Hoffer K (CNM)  (Signed 14-Aug-13 10:04)  Authored: L&D Evaluation   Last Updated: 14-Aug-13 10:04 by Vella KohlerBrothers, Yasuko Lapage K (CNM)

## 2015-03-23 NOTE — H&P (Signed)
L&D Evaluation:  History Expanded:   HPI 29 yo G5P4004 at 3536 4/7+ weeks with EDD of 09/27/12 per LMP and 9 week US. [facility], conception with Paragard IUD (US has not been able to locate IUD), Renal Colic. Pt presents with regular contractions. Dilated 1 cm on admission and change to 2-3 cm per RN.  No vb or ROM.    Gravida 5    Term 4    Blood Type (Maternal) O negative, RH negative    Group B Strep Results Maternal (Result >5wks must be treated as unknown) unknown/result > 5 weeks ago  drawn 10/18 - [employer] [employer], pending    Maternal HIV Negative    Maternal Syphilis Ab Nonreactive    Maternal Varicella Immune    Rubella Results (Maternal) immune    Maternal T-Dap Immune    Patient's Medical History Nephrolithiasis with prior pregnancies    Patient's Surgical History Tonsillectomy & Adenoidectomy, breast surgery    Medications Pre Natal Vitamins    Allergies Ibuprofen (pt states she has taken recently without problems)    Social History none    Family History Non-Contributory   ROS:   ROS see HPI   Exam:   Vital Signs stable    General no apparent distress    Mental Status clear    Chest clear    Heart no murmur/gallop/rubs    Abdomen gravid, non-tender    Estimated Fetal Weight Average for gestational age    Back no CVAT    Edema no edema    Pelvic no external lesions, 3/75/-2 to -3    Mebranes Intact    FHT normal rate with no decels    Ucx regular    Skin dry   Impression:   Impression early labor   Plan:   Plan UA, EFM/NST, monitor contractions and for cervical change, antibiotics for GBBS prophylaxis    Comments Anticipate pt will proceed to active labor and have vaginal delivery   Electronic Signatures: Vella KohlerBrothers, Sheniya Garciaperez K (CNM)  (Signed 22-Oct-13 01:07)  Authored: L&D Evaluation   Last Updated: 22-Oct-13 01:07 by Vella KohlerBrothers, Keishon Chavarin K (CNM)

## 2015-03-23 NOTE — H&P (Signed)
L&D Evaluation:  History Expanded:   HPI 29 yo G5P4004 at 30+ weeks with EDD of 09/27/12 per LMP and 9 week US. [facility], conception with Paragard IUD (US has not been able to locate IUD), Renal Colic with pending renal US. (Prenatal records not available.) Pt presents with contraction pain and low back pain (mild).  No vb or ROM.    Gravida 5    Term 4    Blood Type (Maternal) RH negative    Group B Strep Results Maternal (Result >5wks must be treated as unknown) unknown/result > 5 weeks ago    Patient's Medical History Nephrolithiasis with prior pregnancies    Patient's Surgical History Tonsillectomy & Adenoidectomy    Medications Pre Natal Vitamins    Allergies Ibuprofen    Social History none    Family History Non-Contributory   ROS:   ROS see HPI   Exam:   Vital Signs stable    Urine Protein negative dipstick    General no apparent distress    Mental Status clear    Chest clear    Heart no murmur/gallop/rubs    Abdomen gravid, non-tender    Estimated Fetal Weight Average for gestational age    Back no CVAT    Edema no edema    Pelvic no external lesions, cervix closed and thick    Mebranes Intact    FHT appropriate for gestational age    Ucx irregular    Skin dry   Impression:   Impression IUP at 30 weeks   Plan:   Plan UA, EFM/NST, monitor contractions and for cervical change, fluids    Comments fetal fibronectin also done and sent likely false labor, counseled as such outpatient management discussed   Electronic Signatures: Letitia LibraHarris, Mikell Camp Paul (MD)  (Signed 06-Sep-13 16:42)  Authored: L&D Evaluation   Last Updated: 06-Sep-13 16:42 by Letitia LibraHarris, Brentt Fread Paul (MD)

## 2015-03-24 ENCOUNTER — Other Ambulatory Visit: Payer: Self-pay | Admitting: Family Medicine

## 2015-03-24 NOTE — Telephone Encounter (Signed)
02/10/2015 

## 2015-03-25 NOTE — Telephone Encounter (Signed)
Approved: okay #30 x 0 for each this time She needs follow up appt within the next few months

## 2015-03-25 NOTE — Telephone Encounter (Signed)
Pt called back and I stated she needs a appt in the next few months, pt states she will call back.

## 2015-03-25 NOTE — Telephone Encounter (Signed)
rx called into pharmacy Tried calling both numbers and cell phone VM is full, and the other number is incorrect.

## 2015-03-29 ENCOUNTER — Encounter: Payer: Self-pay | Admitting: Internal Medicine

## 2015-03-29 ENCOUNTER — Ambulatory Visit (INDEPENDENT_AMBULATORY_CARE_PROVIDER_SITE_OTHER): Payer: 59 | Admitting: Internal Medicine

## 2015-03-29 VITALS — BP 110/60 | HR 82 | Temp 98.3°F | Wt 129.0 lb

## 2015-03-29 DIAGNOSIS — R1030 Lower abdominal pain, unspecified: Secondary | ICD-10-CM | POA: Diagnosis not present

## 2015-03-29 DIAGNOSIS — R1031 Right lower quadrant pain: Secondary | ICD-10-CM | POA: Diagnosis not present

## 2015-03-29 LAB — POCT URINALYSIS DIPSTICK
Bilirubin, UA: NEGATIVE
Blood, UA: NEGATIVE
Glucose, UA: NEGATIVE
Ketones, UA: NEGATIVE
Leukocytes, UA: NEGATIVE
Nitrite, UA: NEGATIVE
Protein, UA: NEGATIVE
Spec Grav, UA: 1.01
Urobilinogen, UA: NEGATIVE
pH, UA: 6

## 2015-03-29 LAB — POCT URINE PREGNANCY: Preg Test, Ur: NEGATIVE

## 2015-03-29 NOTE — Assessment & Plan Note (Addendum)
Not consistent with appendicitis--history or exam Could be ovarian cyst or Mittleschmerz Urinalysis negative and no symptoms suggestive or UTI Pregnancy negative---biggest concern of hers was ectopic  Reviewed that no intervention likely needed Discussed OTC NSAIDs prn --- increase ibuprofen to 600 tid Ultrasound if worsens
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
## 2015-03-29 NOTE — Patient Instructions (Signed)
Please increase the ibuprofen to 600mg  (3 of the over the counter tabs) three times a day with food. Let me know if it gets worse  Ovarian Cyst An ovarian cyst is a fluid-filled sac that forms on an ovary. The ovaries are small organs that produce eggs in women. Various types of cysts can form on the ovaries. Most are not cancerous. Many do not cause problems, and they often go away on their own. Some may cause symptoms and require treatment. Common types of ovarian cysts include:  Functional cysts--These cysts may occur every month during the menstrual cycle. This is normal. The cysts usually go away with the next menstrual cycle if the woman does not get pregnant. Usually, there are no symptoms with a functional cyst.  Endometrioma cysts--These cysts form from the tissue that lines the uterus. They are also called "chocolate cysts" because they become filled with blood that turns brown. This type of cyst can cause pain in the lower abdomen during intercourse and with your menstrual period.  Cystadenoma cysts--This type develops from the cells on the outside of the ovary. These cysts can get very big and cause lower abdomen pain and pain with intercourse. This type of cyst can twist on itself, cut off its blood supply, and cause severe pain. It can also easily rupture and cause a lot of pain.  Dermoid cysts--This type of cyst is sometimes found in both ovaries. These cysts may contain different kinds of body tissue, such as skin, teeth, hair, or cartilage. They usually do not cause symptoms unless they get very big.  Theca lutein cysts--These cysts occur when too much of a certain hormone (human chorionic gonadotropin) is produced and overstimulates the ovaries to produce an egg. This is most common after procedures used to assist with the conception of a baby (in vitro fertilization). CAUSES   Fertility drugs can cause a condition in which multiple large cysts are formed on the ovaries. This is  called ovarian hyperstimulation syndrome.  A condition called polycystic ovary syndrome can cause hormonal imbalances that can lead to nonfunctional ovarian cysts. SIGNS AND SYMPTOMS  Many ovarian cysts do not cause symptoms. If symptoms are present, they may include:  Pelvic pain or pressure.  Pain in the lower abdomen.  Pain during sexual intercourse.  Increasing girth (swelling) of the abdomen.  Abnormal menstrual periods.  Increasing pain with menstrual periods.  Stopping having menstrual periods without being pregnant. DIAGNOSIS  These cysts are commonly found during a routine or annual pelvic exam. Tests may be ordered to find out more about the cyst. These tests may include:  Ultrasound.  X-ray of the pelvis.  CT scan.  MRI.  Blood tests. TREATMENT  Many ovarian cysts go away on their own without treatment. Your health care provider may want to check your cyst regularly for 2-3 months to see if it changes. For women in menopause, it is particularly important to monitor a cyst closely because of the higher rate of ovarian cancer in menopausal women. When treatment is needed, it may include any of the following:  A procedure to drain the cyst (aspiration). This may be done using a long needle and ultrasound. It can also be done through a laparoscopic procedure. This involves using a thin, lighted tube with a tiny camera on the end (laparoscope) inserted through a small incision.  Surgery to remove the whole cyst. This may be done using laparoscopic surgery or an open surgery involving a larger incision in the  lower abdomen.  Hormone treatment or birth control pills. These methods are sometimes used to help dissolve a cyst. HOME CARE INSTRUCTIONS   Only take over-the-counter or prescription medicines as directed by your health care provider.  Follow up with your health care provider as directed.  Get regular pelvic exams and Pap tests. SEEK MEDICAL CARE IF:   Your  periods are late, irregular, or painful, or they stop.  Your pelvic pain or abdominal pain does not go away.  Your abdomen becomes larger or swollen.  You have pressure on your bladder or trouble emptying your bladder completely.  You have pain during sexual intercourse.  You have feelings of fullness, pressure, or discomfort in your stomach.  You lose weight for no apparent reason.  You feel generally ill.  You become constipated.  You lose your appetite.  You develop acne.  You have an increase in body and facial hair.  You are gaining weight, without changing your exercise and eating habits.  You think you are pregnant. SEEK IMMEDIATE MEDICAL CARE IF:   You have increasing abdominal pain.  You feel sick to your stomach (nauseous), and you throw up (vomit).  You develop a fever that comes on suddenly.  You have abdominal pain during a bowel movement.  Your menstrual periods become heavier than usual. MAKE SURE YOU:  Understand these instructions.  Will watch your condition.  Will get help right away if you are not doing well or get worse. Document Released: 10/30/2005 Document Revised: 11/04/2013 Document Reviewed: 07/07/2013 [facility] Patient Information 2015 [place], [place]. This information is not intended to replace advice given to you by your health care provider. Make sure you discuss any questions you have with your health care provider.

## 2015-11-25 ENCOUNTER — Encounter: Payer: Self-pay | Admitting: Primary Care

## 2015-11-25 ENCOUNTER — Ambulatory Visit (INDEPENDENT_AMBULATORY_CARE_PROVIDER_SITE_OTHER): Payer: BLUE CROSS/BLUE SHIELD | Admitting: Primary Care

## 2015-11-25 VITALS — BP 124/82 | HR 108 | Temp 99.8°F | Ht 67.0 in | Wt 141.4 lb

## 2015-11-25 DIAGNOSIS — R059 Cough, unspecified: Secondary | ICD-10-CM

## 2015-11-25 DIAGNOSIS — R05 Cough: Secondary | ICD-10-CM | POA: Diagnosis not present

## 2015-11-25 MED ORDER — BENZONATATE 200 MG PO CAPS: 200.0000 mg | ORAL_CAPSULE | Freq: Three times a day (TID) | ORAL | Status: DC | PRN

## 2015-11-25 MED ORDER — HYDROCODONE-HOMATROPINE 5-1.5 MG/5ML PO SYRP: 5.0000 mL | ORAL_SOLUTION | Freq: Every evening | ORAL | Status: DC | PRN
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
## 2015-11-25 NOTE — Patient Instructions (Signed)
Your symptoms are likely related to a virus. Your flu test was negative.  Fever and Body Aches: Ibuprofen 600 mg three times daily as needed.  Cough: You may take Benzonatate capsules for day time cough. Take 1 capsule by mouth three times daily as needed for cough. You may take the Hycodan cough suppressant at bedtime as needed for cough and rest. Caution this medication contains codeine and will make you feel drowsy.  Nasal Congestion:Try using Flonase (fluticasone) nasal spray. Instill 2 sprays in each nostril once daily.   Ensure you are staying hydrated with water.   Call me if no improvement in symptoms Tuesday next week.  It was a pleasure meeting you!  Upper Respiratory Infection, Adult Most upper respiratory infections (URIs) are a viral infection of the air passages leading to the lungs. A URI affects the nose, throat, and upper air passages. The most common type of URI is nasopharyngitis and is typically referred to as "the common cold." URIs run their course and usually go away on their own. Most of the time, a URI does not require medical attention, but sometimes a bacterial infection in the upper airways can follow a viral infection. This is called a secondary infection. Sinus and middle ear infections are common types of secondary upper respiratory infections. Bacterial pneumonia can also complicate a URI. A URI can worsen asthma and chronic obstructive pulmonary disease (COPD). Sometimes, these complications can require emergency medical care and may be life threatening.  CAUSES Almost all URIs are caused by viruses. A virus is a type of germ and can spread from one person to another.  RISKS FACTORS You may be at risk for a URI if:   You smoke.   You have chronic heart or lung disease.  You have a weakened defense (immune) system.   You are very young or very old.   You have nasal allergies or asthma.  You work in crowded or poorly ventilated areas.  You work in  health care facilities or schools. SIGNS AND SYMPTOMS  Symptoms typically develop 2-3 days after you come in contact with a cold virus. Most viral URIs last 7-10 days. However, viral URIs from the influenza virus (flu virus) can last 14-18 days and are typically more severe. Symptoms may include:   Runny or stuffy (congested) nose.   Sneezing.   Cough.   Sore throat.   Headache.   Fatigue.   Fever.   Loss of appetite.   Pain in your forehead, behind your eyes, and over your cheekbones (sinus pain).  Muscle aches.  DIAGNOSIS  Your health care provider may diagnose a URI by:  Physical exam.  Tests to check that your symptoms are not due to another condition such as:  Strep throat.  Sinusitis.  Pneumonia.  Asthma. TREATMENT  A URI goes away on its own with time. It cannot be cured with medicines, but medicines may be prescribed or recommended to relieve symptoms. Medicines may help:  Reduce your fever.  Reduce your cough.  Relieve nasal congestion. HOME CARE INSTRUCTIONS   Take medicines only as directed by your health care provider.   Gargle warm saltwater or take cough drops to comfort your throat as directed by your health care provider.  Use a warm mist humidifier or inhale steam from a shower to increase air moisture. This may make it easier to breathe.  Drink enough fluid to keep your urine clear or pale yellow.   Eat soups and other clear broths  and maintain good nutrition.   Rest as needed.   Return to work when your temperature has returned to normal or as your health care provider advises. You may need to stay home longer to avoid infecting others. You can also use a face mask and careful hand washing to prevent spread of the virus.  Increase the usage of your inhaler if you have asthma.   Do not use any tobacco products, including cigarettes, chewing tobacco, or electronic cigarettes. If you need help quitting, ask your health care  provider. PREVENTION  The best way to protect yourself from getting a cold is to practice good hygiene.   Avoid oral or hand contact with people with cold symptoms.   Wash your hands often if contact occurs.  There is no clear evidence that vitamin C, vitamin E, echinacea, or exercise reduces the chance of developing a cold. However, it is always recommended to get plenty of rest, exercise, and practice good nutrition.  SEEK MEDICAL CARE IF:   You are getting worse rather than better.   Your symptoms are not controlled by medicine.   You have chills.  You have worsening shortness of breath.  You have brown or red mucus.  You have yellow or brown nasal discharge.  You have pain in your face, especially when you bend forward.  You have a fever.  You have swollen neck glands.  You have pain while swallowing.  You have white areas in the back of your throat. SEEK IMMEDIATE MEDICAL CARE IF:   You have severe or persistent:  Headache.  Ear pain.  Sinus pain.  Chest pain.  You have chronic lung disease and any of the following:  Wheezing.  Prolonged cough.  Coughing up blood.  A change in your usual mucus.  You have a stiff neck.  You have changes in your:  Vision.  Hearing.  Thinking.  Mood. MAKE SURE YOU:   Understand these instructions.  Will watch your condition.  Will get help right away if you are not doing well or get worse.   This information is not intended to replace advice given to you by your health care provider. Make sure you discuss any questions you have with your health care provider.   Document Released: 04/25/2001 Document Revised: 03/16/2015 Document Reviewed: 02/04/2014 Elsevier Interactive Patient Education [employer]

## 2015-11-30 ENCOUNTER — Ambulatory Visit: Payer: BLUE CROSS/BLUE SHIELD | Admitting: Family Medicine

## 2016-06-14 ENCOUNTER — Ambulatory Visit: Payer: BLUE CROSS/BLUE SHIELD | Admitting: Family Medicine

## 2016-06-14 DIAGNOSIS — Z0289 Encounter for other administrative examinations: Secondary | ICD-10-CM

## 2016-09-13 ENCOUNTER — Ambulatory Visit (INDEPENDENT_AMBULATORY_CARE_PROVIDER_SITE_OTHER)
Admission: RE | Admit: 2016-09-13 | Discharge: 2016-09-13 | Disposition: A | Discharge status: Home / Self Care | Payer: BLUE CROSS/BLUE SHIELD | Source: Ambulatory Visit | Attending: Family Medicine | Admitting: Family Medicine

## 2016-09-13 ENCOUNTER — Encounter: Payer: Self-pay | Admitting: Family Medicine

## 2016-09-13 ENCOUNTER — Ambulatory Visit (INDEPENDENT_AMBULATORY_CARE_PROVIDER_SITE_OTHER): Payer: BLUE CROSS/BLUE SHIELD | Admitting: Family Medicine

## 2016-09-13 VITALS — BP 110/70 | HR 94 | Temp 98.3°F | Ht 67.0 in | Wt 164.5 lb

## 2016-09-13 DIAGNOSIS — M545 Low back pain, unspecified: Secondary | ICD-10-CM

## 2016-09-13 DIAGNOSIS — Z79899 Other long term (current) drug therapy: Secondary | ICD-10-CM

## 2016-09-13 DIAGNOSIS — N6310 Unspecified lump in the right breast, unspecified quadrant: Secondary | ICD-10-CM | POA: Diagnosis not present

## 2016-09-13 DIAGNOSIS — R5383 Other fatigue: Secondary | ICD-10-CM

## 2016-09-13 MED ORDER — CYCLOBENZAPRINE HCL 10 MG PO TABS: 5.0000 mg | ORAL_TABLET | Freq: Every day | ORAL | 1 refills | Status: DC

## 2016-09-13 NOTE — Patient Instructions (Signed)

## 2016-09-13 NOTE — Progress Notes (Signed)
Pre visit review using our clinic review tool, if applicable. No additional management support is needed unless otherwise documented below in the visit note. 
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
## 2016-09-14 ENCOUNTER — Telehealth: Payer: Self-pay | Admitting: Family Medicine

## 2016-09-14 ENCOUNTER — Telehealth: Payer: Self-pay

## 2016-09-14 ENCOUNTER — Other Ambulatory Visit: Payer: Self-pay | Admitting: Family Medicine

## 2016-09-14 ENCOUNTER — Ambulatory Visit
Admission: RE | Admit: 2016-09-14 | Discharge: 2016-09-14 | Disposition: A | Discharge status: Home / Self Care | Payer: BLUE CROSS/BLUE SHIELD | Source: Ambulatory Visit | Attending: Family Medicine | Admitting: Family Medicine

## 2016-09-14 DIAGNOSIS — N6310 Unspecified lump in the right breast, unspecified quadrant: Secondary | ICD-10-CM

## 2016-09-14 DIAGNOSIS — N6452 Nipple discharge: Secondary | ICD-10-CM

## 2016-09-14 LAB — CBC WITH DIFFERENTIAL/PLATELET
Basophils Absolute: 0 10*3/uL (ref 0.0–0.1)
Basophils Relative: 0.5 % (ref 0.0–3.0)
Eosinophils Absolute: 0.4 10*3/uL (ref 0.0–0.7)
Eosinophils Relative: 4.2 % (ref 0.0–5.0)
HCT: 41.7 % (ref 36.0–46.0)
Hemoglobin: 13.3 g/dL (ref 12.0–15.0)
Lymphocytes Relative: 26.9 % (ref 12.0–46.0)
Lymphs Abs: 2.3 10*3/uL (ref 0.7–4.0)
MCHC: 31.9 g/dL (ref 30.0–36.0)
MCV: 68.9 fl — ABNORMAL LOW (ref 78.0–100.0)
Monocytes Absolute: 0.5 10*3/uL (ref 0.1–1.0)
Monocytes Relative: 6.5 % (ref 3.0–12.0)
Neutro Abs: 5.2 10*3/uL (ref 1.4–7.7)
Neutrophils Relative %: 61.9 % (ref 43.0–77.0)
Platelets: 283 10*3/uL (ref 150.0–400.0)
RBC: 6.05 Mil/uL — ABNORMAL HIGH (ref 3.87–5.11)
RDW: 21 % — ABNORMAL HIGH (ref 11.5–15.5)
WBC: 8.4 10*3/uL (ref 4.0–10.5)

## 2016-09-14 LAB — TSH: TSH: 2.46 u[IU]/mL (ref 0.35–4.50)

## 2016-09-14 LAB — HEPATIC FUNCTION PANEL
ALT: 31 U/L (ref 0–35)
AST: 32 U/L (ref 0–37)
Albumin: 4.7 g/dL (ref 3.5–5.2)
Alkaline Phosphatase: 81 U/L (ref 39–117)
Bilirubin, Direct: 0.1 mg/dL (ref 0.0–0.3)
Total Bilirubin: 0.4 mg/dL (ref 0.2–1.2)
Total Protein: 7.7 g/dL (ref 6.0–8.3)

## 2016-09-14 LAB — BASIC METABOLIC PANEL
BUN: 15 mg/dL (ref 6–23)
CO2: 31 mEq/L (ref 19–32)
Calcium: 10 mg/dL (ref 8.4–10.5)
Chloride: 102 mEq/L (ref 96–112)
Creatinine, Ser: 1.1 mg/dL (ref 0.40–1.20)
GFR: 62.3 mL/min (ref >60.00)
Glucose, Bld: 72 mg/dL (ref 70–99)
Potassium: 3.9 mEq/L (ref 3.5–5.1)
Sodium: 140 mEq/L (ref 135–145)

## 2016-09-14 NOTE — Telephone Encounter (Signed)
Done

## 2016-09-14 NOTE — Telephone Encounter (Signed)
Pt scheduled today 11/2 at 1pm at [facility]. Pt aware

## 2016-09-14 NOTE — Telephone Encounter (Signed)
Judith Martinez, Can you please change pt's breast ultrasound order to [id number]?   [facility] does not do complete ultrasounds.

## 2016-09-14 NOTE — Telephone Encounter (Signed)
Judith [facility] with [facility] request verbal order for bilateral tomo diagnostic mammogram with implants; pt has discharge from rt breast.  Dr Ermalene SearingBedsole said yes to verbal order as requested. Judith Martinez is putting order in and request cosignature by Dr Ermalene SearingBedsole.

## 2016-09-16 ENCOUNTER — Ambulatory Visit
Admission: RE | Admit: 2016-09-16 | Discharge: 2016-09-16 | Disposition: A | Discharge status: Home / Self Care | Payer: BLUE CROSS/BLUE SHIELD | Source: Ambulatory Visit | Attending: Family Medicine | Admitting: Family Medicine

## 2016-09-16 DIAGNOSIS — M545 Low back pain, unspecified: Secondary | ICD-10-CM

## 2016-09-18 ENCOUNTER — Telehealth: Payer: Self-pay | Admitting: *Deleted

## 2016-09-18 ENCOUNTER — Other Ambulatory Visit: Payer: Self-pay | Admitting: Family Medicine

## 2016-09-18 DIAGNOSIS — N6452 Nipple discharge: Secondary | ICD-10-CM

## 2016-09-18 MED ORDER — PREDNISONE 20 MG PO TABS: ORAL_TABLET | ORAL | 0 refills | Status: DC

## 2016-09-18 NOTE — Telephone Encounter (Signed)
-----   Message from Hannah BeatSpencer Copland, MD sent at 09/18/2016  9:18 AM EST ----- Spoke to patient.  Can we call in  Prednisone 20 mg, 2 tabs po for 5 days, then 1 tab po for 5 days, #15, 0 ref  F/u with me in 3 weeks

## 2016-09-18 NOTE — Telephone Encounter (Signed)
Pred[facility] Rx [facility] as instructed by Dr. Patsy Lageropland.  Follow up appointment scheduled for 10/09/2016 at 2:15 pm with Dr. Patsy Lageropland.

## 2016-09-20 ENCOUNTER — Other Ambulatory Visit: Payer: BLUE CROSS/BLUE SHIELD

## 2016-10-02 ENCOUNTER — Telehealth: Payer: Self-pay | Admitting: Family Medicine

## 2016-10-02 NOTE — Telephone Encounter (Signed)
I have also tried to call multiple times as well. I will send a certified letter after one final attempt.

## 2016-10-02 NOTE — Telephone Encounter (Signed)
Originally started calling this patient to try to schedule her for a Breast MRI. Could never get patient on the phone to discuss the MRI, she was aware that the order was placed by Dr Patsy Lageropland. Called [employer] insurance to get Prior Authorization, they declined my attempt to get Authorization and also Dr Patsy Lageropland did a Peer to Peer [facility] a Physician [facility] where they also denied the MRI. After several attempts to contact this patient to let her know that it was not approved we could not ever get her on the phone. Please cancel the MRI in EPIC.

## 2016-10-09 ENCOUNTER — Encounter: Payer: Self-pay | Admitting: Family Medicine

## 2016-10-09 ENCOUNTER — Ambulatory Visit: Payer: BLUE CROSS/BLUE SHIELD | Admitting: Family Medicine

## 2016-10-09 NOTE — Telephone Encounter (Signed)
Certified letter sent today. Patient also missed follow-up appointment.

## 2016-10-09 NOTE — Addendum Note (Signed)
Addended by: Damita LackLORING, DONNA S on: 10/09/2016 04:56 PM   Modules accepted: Orders

## 2016-10-10 NOTE — Telephone Encounter (Signed)
Can you call to set appt up tomorrow for her. It looks like I have 2 appts tomorrow morning.   If needed, I can open up my 4:15 appt slot and we can work late.

## 2016-10-23 ENCOUNTER — Ambulatory Visit: Payer: BLUE CROSS/BLUE SHIELD | Admitting: Family Medicine

## 2016-12-27 ENCOUNTER — Encounter: Payer: Self-pay | Admitting: *Deleted

## 2016-12-27 ENCOUNTER — Emergency Department: Payer: BLUE CROSS/BLUE SHIELD

## 2016-12-27 ENCOUNTER — Emergency Department
Admission: EM | Admit: 2016-12-27 | Discharge: 2016-12-27 | Disposition: A | Discharge status: Home / Self Care | Payer: BLUE CROSS/BLUE SHIELD | Attending: Emergency Medicine | Admitting: Emergency Medicine

## 2016-12-27 DIAGNOSIS — R1031 Right lower quadrant pain: Secondary | ICD-10-CM

## 2016-12-27 DIAGNOSIS — Z79899 Other long term (current) drug therapy: Secondary | ICD-10-CM | POA: Diagnosis not present

## 2016-12-27 DIAGNOSIS — R110 Nausea: Secondary | ICD-10-CM | POA: Diagnosis not present

## 2016-12-27 DIAGNOSIS — R1032 Left lower quadrant pain: Secondary | ICD-10-CM | POA: Diagnosis not present

## 2016-12-27 DIAGNOSIS — R509 Fever, unspecified: Secondary | ICD-10-CM | POA: Diagnosis not present

## 2016-12-27 DIAGNOSIS — R109 Unspecified abdominal pain: Secondary | ICD-10-CM

## 2016-12-27 LAB — URINALYSIS, COMPLETE (UACMP) WITH MICROSCOPIC
Bacteria, UA: NONE SEEN
Bilirubin Urine: NEGATIVE
Glucose, UA: NEGATIVE mg/dL
Hgb urine dipstick: NEGATIVE
Ketones, ur: NEGATIVE mg/dL
Nitrite: NEGATIVE
Protein, ur: NEGATIVE mg/dL
Specific Gravity, Urine: 1.011 (ref 1.005–1.030)
Squamous Epithelial / LPF: NONE SEEN
pH: 7 (ref 5.0–8.0)

## 2016-12-27 LAB — COMPREHENSIVE METABOLIC PANEL
ALT: 35 U/L (ref 14–54)
AST: 45 U/L — ABNORMAL HIGH (ref 15–41)
Albumin: 4.6 g/dL (ref 3.5–5.0)
Alkaline Phosphatase: 82 U/L (ref 38–126)
Anion gap: 9 (ref 5–15)
BUN: 13 mg/dL (ref 6–20)
CO2: 26 mmol/L (ref 22–32)
Calcium: 9.3 mg/dL (ref 8.9–10.3)
Chloride: 103 mmol/L (ref 101–111)
Creatinine, Ser: 1.29 mg/dL — ABNORMAL HIGH (ref 0.44–1.00)
GFR calc Af Amer: >60 mL/min (ref >60)
GFR calc non Af Amer: 55 mL/min — ABNORMAL LOW (ref >60)
Glucose, Bld: 97 mg/dL (ref 65–99)
Potassium: 3.6 mmol/L (ref 3.5–5.1)
Sodium: 138 mmol/L (ref 135–145)
Total Bilirubin: 0.6 mg/dL (ref 0.3–1.2)
Total Protein: 8 g/dL (ref 6.5–8.1)

## 2016-12-27 LAB — CBC
HCT: 42.5 % (ref 35.0–47.0)
Hemoglobin: 14.1 g/dL (ref 12.0–16.0)
MCH: 25 pg — ABNORMAL LOW (ref 26.0–34.0)
MCHC: 33.2 g/dL (ref 32.0–36.0)
MCV: 75.3 fL — ABNORMAL LOW (ref 80.0–100.0)
Platelets: 262 10*3/uL (ref 150–440)
RBC: 5.64 MIL/uL — ABNORMAL HIGH (ref 3.80–5.20)
RDW: 15.6 % — ABNORMAL HIGH (ref 11.5–14.5)
WBC: 7.9 10*3/uL (ref 3.6–11.0)

## 2016-12-27 LAB — POCT PREGNANCY, URINE: Preg Test, Ur: NEGATIVE

## 2016-12-27 LAB — LIPASE, BLOOD: Lipase: 32 U/L (ref 11–51)

## 2016-12-27 MED ORDER — SODIUM CHLORIDE 0.9 % IV BOLUS (SEPSIS)
1000.0000 mL | Freq: Once | INTRAVENOUS | Status: AC
  Administered 2016-12-27: 1000 mL via INTRAVENOUS

## 2016-12-27 MED ORDER — ONDANSETRON 4 MG PO TBDP
4.0000 mg | ORAL_TABLET | Freq: Once | ORAL | Status: AC | PRN
  Administered 2016-12-27: 4 mg via ORAL
  Filled 2016-12-27: qty 1

## 2016-12-27 MED ORDER — KETOROLAC TROMETHAMINE 30 MG/ML IJ SOLN
INTRAMUSCULAR | Status: DC
Start: 2016-12-27 — End: 2016-12-28
  Filled 2016-12-27: qty 1

## 2016-12-27 MED ORDER — OXYCODONE-ACETAMINOPHEN 5-325 MG PO TABS
1.0000 | ORAL_TABLET | ORAL | Status: DC | PRN
  Administered 2016-12-27: 1 via ORAL
  Filled 2016-12-27: qty 1

## 2016-12-27 MED ORDER — MORPHINE SULFATE (PF) 4 MG/ML IV SOLN
4.0000 mg | Freq: Once | INTRAVENOUS | Status: DC
  Filled 2016-12-27: qty 1

## 2016-12-27 MED ORDER — IOPAMIDOL (ISOVUE-300) INJECTION 61%
30.0000 mL | Freq: Once | INTRAVENOUS | Status: AC | PRN
  Administered 2016-12-27: 30 mL via ORAL
  Filled 2016-12-27: qty 30

## 2016-12-27 MED ORDER — IOPAMIDOL (ISOVUE-300) INJECTION 61%
100.0000 mL | Freq: Once | INTRAVENOUS | Status: AC | PRN
  Administered 2016-12-27: 100 mL via INTRAVENOUS
  Filled 2016-12-27: qty 100

## 2016-12-27 MED ORDER — KETOROLAC TROMETHAMINE 30 MG/ML IJ SOLN
30.0000 mg | Freq: Once | INTRAMUSCULAR | Status: AC
  Administered 2016-12-27: 30 mg via INTRAVENOUS

## 2016-12-27 NOTE — Discharge Instructions (Signed)

## 2016-12-27 NOTE — ED Notes (Signed)
Pt presents with bilateral lower back pain and flank pain. She states that it started today on the right side and then worked around to the left, which hurts worse than the right at this time. Denies urinary symptoms; states she has hx of ovarian cysts. She also has a headache on and off x 2 weeks. She states she has fuzzy vision and there are halos around the lights. Pt alert & oriented, able to answer questions, but obviously feeling poorly.

## 2016-12-27 NOTE — ED Notes (Signed)
Patient @ CT

## 2016-12-27 NOTE — ED Triage Notes (Signed)
States headache on and off for 1 week, states bilateral flank pain but denies any urinary burning or odor that began this AM, states lower abd cramping as well, pt tearful, states she saw her PCP this AM, had an abd xray done, awake and alert

## 2016-12-27 NOTE — ED Provider Notes (Signed)
[facility] Emergency Department Provider Note  ____________________________________________  Time seen: Approximately 7:04 PM  I have reviewed the triage vital signs and the nursing notes.   HISTORY  Chief Complaint Abdominal Pain   HPI Judith Martinez is a 31 y.o. female with a history of migraines, ovarian cysts, and kidney stones who presents for evaluation of abdominal pain. Patient reports 1 week of bilateral lower abdominal pain that she describes as contractions, intermittent, sharp, moderate in nature. This morning when she woke up she had bilateral flank pain that she describes as a dull constant pain and also pain in the left lower quadrant which is the same she has had for the course of the week. She has had 2 days of low-grade fever and today had a temp of 104F. no cough, congestion, shortness of breath, body aches, chest pain. She denies dysuria or hematuria. She has had nausea but no vomiting, no diarrhea. She went to urgent care today and had a flu swab that was negative and a KUB. She was then sent over here for further evaluation. Patient has had 2 prior abdominal surgeries which included tubal ligation and reversal of her tubal ligation.  Past Medical History:  Diagnosis Date  . Headache(784.0)    frequent  . History of kidney stones 2013  . History of pyelonephritis 2012  . Hx of migraines     Patient Active Problem List   Diagnosis Date Noted  . RLQ abdominal pain 03/29/2015  . MDD (major depressive disorder), single episode 10/14/2014  . Adjustment disorder with anxiety 02/14/2013  . ZOXWRUEA(540.9)     Past Surgical History:  Procedure Laterality Date  . BREAST ENHANCEMENT SURGERY  2012  . TONSILLECTOMY  1994  . TUBAL LIGATION  10/13    Prior to Admission medications   Medication Sig Start Date End Date Taking? Authorizing Provider  cyclobenzaprine (FLEXERIL) 10 MG tablet Take 0.5-1 tablets (5-10 mg total) by mouth at bedtime.  09/13/16   Hannah Beat, MD  predniSONE (DELTASONE) 20 MG tablet Take 2 tablet by mouth x 5 days, then 1 tab by mouth x 5 days 09/18/16   Hannah Beat, MD    Allergies Patient has no known allergies.  Family History  Problem Relation Age of Onset  . Hypertension Father   . Migraines Father   . Hyperlipidemia Father   . CAD Paternal Grandfather   . Parkinson's disease Paternal Grandfather   . Cancer Paternal Grandfather     leukemia  . Stroke Paternal Grandfather   . Cancer Sister 4    tumor in leg  . Cancer Other     maternal great grandmother; breast  . Diabetes Paternal Grandmother   . Fibromyalgia Mother     Social History Social History  Substance Use Topics  . Smoking status: Never Smoker  . Smokeless tobacco: Never Used  . Alcohol use Yes     Comment: Occasional    Review of Systems  Constitutional: + fever. Eyes: Negative for visual changes. ENT: Negative for sore throat. Neck: No neck pain  Cardiovascular: Negative for chest pain. Respiratory: Negative for shortness of breath. Gastrointestinal: + lower abdominal pain and nausea. No vomiting or diarrhea. Genitourinary: Negative for dysuria. Musculoskeletal: Negative for back pain. Skin: Negative for rash. Neurological: Negative for headaches, weakness or numbness. Psych: No SI or HI  ____________________________________________   PHYSICAL EXAM:  VITAL SIGNS: ED Triage Vitals  Enc Vitals Group     BP 12/27/16 1656 (!) 151/94  Pulse Rate 12/27/16 1656 (!) 104     Resp 12/27/16 1656 18     Temp 12/27/16 1656 99.7 F (37.6 C)     Temp Source 12/27/16 1656 Oral     SpO2 12/27/16 1656 98 %     Weight 12/27/16 1657 150 lb (68 kg)     Height 12/27/16 1657 5\' 6"  (1.676 m)     Head Circumference --      Peak Flow --      Pain Score 12/27/16 1657 7     Pain Loc --      Pain Edu? --      Excl. in GC? --     Constitutional: Alert and oriented. Well appearing and in no apparent  distress. HEENT:      Head: Normocephalic and atraumatic.         Eyes: Conjunctivae are normal. Sclera is non-icteric. EOMI. PERRL      Mouth/Throat: Mucous membranes are moist.       Neck: Supple with no signs of meningismus. Cardiovascular: Tachycardic with regular rhythm. No murmurs, gallops, or rubs. 2+ symmetrical distal pulses are present in all extremities. No JVD. Respiratory: Normal respiratory effort. Lungs are clear to auscultation bilaterally. No wheezes, crackles, or rhonchi.  Gastrointestinal: Soft, ttp over the RLQ and LUQ, and non distended with positive bowel sounds. No rebound or guarding. Genitourinary: No CVA tenderness. Musculoskeletal: Nontender with normal range of motion in all extremities. No edema, cyanosis, or erythema of extremities. Neurologic: Normal speech and language. Face is symmetric. Moving all extremities. No gross focal neurologic deficits are appreciated. Skin: Skin is warm, dry and intact. No rash noted. Psychiatric: Mood and affect are normal. Speech and behavior are normal.  ____________________________________________   LABS (all labs ordered are listed, but only abnormal results are displayed)  Labs Reviewed  COMPREHENSIVE METABOLIC PANEL - Abnormal; Notable for the following:       Result Value   Creatinine, Ser 1.29 (*)    AST 45 (*)    GFR calc non Af Amer 55 (*)    All other components within normal limits  CBC - Abnormal; Notable for the following:    RBC 5.64 (*)    MCV 75.3 (*)    MCH 25.0 (*)    RDW 15.6 (*)    All other components within normal limits  URINALYSIS, COMPLETE (UACMP) WITH MICROSCOPIC - Abnormal; Notable for the following:    Color, Urine YELLOW (*)    APPearance CLEAR (*)    Leukocytes, UA TRACE (*)    All other components within normal limits  LIPASE, BLOOD  POCT PREGNANCY, URINE   ____________________________________________  EKG  none ____________________________________________  RADIOLOGY  CT  a/P:Negative  TVUS: PND ____________________________________________   PROCEDURES  Procedure(s) performed: None Procedures Critical Care performed:  None ____________________________________________   INITIAL IMPRESSION / ASSESSMENT AND PLAN / ED COURSE  31 y.o. female with a history of migraines, ovarian cysts, and kidney stones who presents for evaluation of b/l lower abdominal pain and b/l flank pain. Patient is in mild distress, has a temp of 99.55F and heart rate of 104. She has tenderness to palpation on the left upper quadrant and right lower quadrant. UA with no evidence of urinary tract infection. Urine pregnancy is negative. CMP and CBC within normal limits. Will get CT a/p to rule out appendicitis. Patient has no vaginal discharge and ttp in multiple areas of her abdomen which makes me think CT is a better study.  If that is negative, will pursue TV US to rule out ovarian cysts, TOA.   Clinical Course as of Dec 30 1098  Wed Dec 27, 2016  2101 CT and labs with no acute findings. Will get TVUS to rule out ovarian pathology.  [CV]    Clinical Course User Index [CV] Nita Sickle, MD   Patient was signed out to Dr. Huel Cote at the end of my shift pending results of the TVUS.  Pertinent labs & imaging results that were available during my care of the patient were reviewed by me and considered in my medical decision making (see chart for details).    ____________________________________________   FINAL CLINICAL IMPRESSION(S) / ED DIAGNOSES  Final diagnoses:  RLQ abdominal pain  Abdominal pain, unspecified abdominal location      NEW MEDICATIONS STARTED DURING THIS VISIT:  Discharge Medication List as of 12/27/2016 10:40 PM       Note:  This document was prepared using Dragon voice recognition software and may include unintentional dictation errors.    Nita Sickle, MD 12/30/16 1102

## 2016-12-27 NOTE — ED Notes (Signed)
CT at bedside 

## 2016-12-29 ENCOUNTER — Telehealth: Payer: Self-pay

## 2016-12-29 NOTE — Telephone Encounter (Signed)
Spoke to pt. She is still having some pain. She has an OV with Dr. Patsy Lageropland on Monday.

## 2017-01-01 ENCOUNTER — Encounter: Payer: Self-pay | Admitting: Family Medicine

## 2017-01-01 ENCOUNTER — Encounter (INDEPENDENT_AMBULATORY_CARE_PROVIDER_SITE_OTHER): Payer: Self-pay

## 2017-01-01 ENCOUNTER — Ambulatory Visit (INDEPENDENT_AMBULATORY_CARE_PROVIDER_SITE_OTHER): Payer: BLUE CROSS/BLUE SHIELD | Admitting: Family Medicine

## 2017-01-01 VITALS — BP 122/82 | HR 88 | Temp 99.4°F | Ht 67.0 in | Wt 156.8 lb

## 2017-01-01 DIAGNOSIS — N6001 Solitary cyst of right breast: Secondary | ICD-10-CM

## 2017-01-01 DIAGNOSIS — R5081 Fever presenting with conditions classified elsewhere: Secondary | ICD-10-CM | POA: Diagnosis not present

## 2017-01-01 DIAGNOSIS — R1011 Right upper quadrant pain: Secondary | ICD-10-CM
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
## 2017-01-01 NOTE — Progress Notes (Signed)
Pre visit review using our clinic review tool, if applicable. No additional management support is needed unless otherwise documented below in the visit note. 

## 2017-01-02 ENCOUNTER — Ambulatory Visit
Admission: RE | Admit: 2017-01-02 | Discharge: 2017-01-02 | Disposition: A | Discharge status: Home / Self Care | Payer: BLUE CROSS/BLUE SHIELD | Source: Ambulatory Visit | Attending: Family Medicine | Admitting: Family Medicine

## 2017-01-02 DIAGNOSIS — R1011 Right upper quadrant pain: Secondary | ICD-10-CM | POA: Insufficient documentation

## 2017-01-03 ENCOUNTER — Ambulatory Visit: Payer: Self-pay | Admitting: Family Medicine

## 2017-01-09 ENCOUNTER — Encounter: Payer: Self-pay | Admitting: *Deleted

## 2017-10-12 ENCOUNTER — Ambulatory Visit: Payer: BLUE CROSS/BLUE SHIELD | Admitting: Internal Medicine

## 2017-10-12 ENCOUNTER — Telehealth: Payer: Self-pay

## 2017-10-12 NOTE — Telephone Encounter (Signed)
Copied from CRM [phone]. Topic: Quick Communication - Appointment Cancellation >> Oct 12, 2017  9:25 AM Arlyss Gandyichardson, Taren N, NT wrote: Patient called to cancel appointment scheduled for today. Patient has not rescheduled their appointment.    Route to department's PEC pool. >> Oct 12, 2017 10:41 AM Patience MuscaIsley, Rena M, LPN wrote: Do you want to charge late cancellation fee and do you want pt to reschedule?

## 2017-10-12 NOTE — Telephone Encounter (Signed)
Don't charge. If is was an acute visit and she is better, no reason to reschedule. I didn't need to see her otherwise (that I know of)

## 2018-01-09 ENCOUNTER — Encounter: Payer: Self-pay | Admitting: Emergency Medicine

## 2018-01-09 ENCOUNTER — Emergency Department
Admission: EM | Admit: 2018-01-09 | Discharge: 2018-01-09 | Disposition: A | Discharge status: Home / Self Care | Payer: BLUE CROSS/BLUE SHIELD | Attending: Emergency Medicine | Admitting: Emergency Medicine

## 2018-01-09 DIAGNOSIS — K625 Hemorrhage of anus and rectum: Secondary | ICD-10-CM

## 2018-01-09 LAB — POCT PREGNANCY, URINE: Preg Test, Ur: NEGATIVE

## 2018-01-09 LAB — CBC WITH DIFFERENTIAL/PLATELET
Basophils Absolute: 0.1 10*3/uL (ref 0–0.1)
Basophils Relative: 1 %
Eosinophils Absolute: 0.2 10*3/uL (ref 0–0.7)
Eosinophils Relative: 4 %
HCT: 45.9 % (ref 35.0–47.0)
Hemoglobin: 15.1 g/dL (ref 12.0–16.0)
Lymphocytes Relative: 22 %
Lymphs Abs: 1 10*3/uL (ref 1.0–3.6)
MCH: 29.4 pg (ref 26.0–34.0)
MCHC: 32.8 g/dL (ref 32.0–36.0)
MCV: 89.5 fL (ref 80.0–100.0)
Monocytes Absolute: 0.2 10*3/uL (ref 0.2–0.9)
Monocytes Relative: 4 %
Neutro Abs: 3.1 10*3/uL (ref 1.4–6.5)
Neutrophils Relative %: 69 %
Platelets: 222 10*3/uL (ref 150–440)
RBC: 5.13 MIL/uL (ref 3.80–5.20)
RDW: 15.6 % — ABNORMAL HIGH (ref 11.5–14.5)
WBC: 4.5 10*3/uL (ref 3.6–11.0)

## 2018-01-09 LAB — COMPREHENSIVE METABOLIC PANEL
ALT: 50 U/L (ref 14–54)
AST: 39 U/L (ref 15–41)
Albumin: 4.9 g/dL (ref 3.5–5.0)
Alkaline Phosphatase: 81 U/L (ref 38–126)
Anion gap: 8 (ref 5–15)
BUN: 15 mg/dL (ref 6–20)
CO2: 25 mmol/L (ref 22–32)
Calcium: 9.7 mg/dL (ref 8.9–10.3)
Chloride: 108 mmol/L (ref 101–111)
Creatinine, Ser: 0.95 mg/dL (ref 0.44–1.00)
GFR calc Af Amer: >60 mL/min (ref >60)
GFR calc non Af Amer: >60 mL/min (ref >60)
Glucose, Bld: 103 mg/dL — ABNORMAL HIGH (ref 65–99)
Potassium: 3.6 mmol/L (ref 3.5–5.1)
Sodium: 141 mmol/L (ref 135–145)
Total Bilirubin: 0.7 mg/dL (ref 0.3–1.2)
Total Protein: 8.2 g/dL — ABNORMAL HIGH (ref 6.5–8.1)

## 2018-01-09 LAB — PROTIME-INR
INR: 1.02
Prothrombin Time: 13.3 seconds (ref 11.4–15.2)

## 2018-01-09 MED ORDER — DOCUSATE SODIUM 100 MG PO CAPS: 100.0000 mg | ORAL_CAPSULE | Freq: Two times a day (BID) | ORAL | 2 refills | Status: DC

## 2018-01-09 MED ORDER — SENNOSIDES-DOCUSATE SODIUM 8.6-50 MG PO TABS: 2.0000 | ORAL_TABLET | Freq: Every day | ORAL | 1 refills | Status: DC

## 2018-01-09 NOTE — ED Notes (Signed)
Pt states when she went to the BR to urinate she passed some bright red blood from her rectum, noted small amount in the toilet.

## 2018-01-09 NOTE — ED Triage Notes (Addendum)
Patient presents to ED via POV from home. Patient reports "I am passing clots out of my rectum". Patient reports this started this morning. Patient reports hx of same but denies seeking treatment at that time. Patient reports blood is a mix of dark and bright red blood.

## 2018-01-09 NOTE — ED Notes (Signed)
AAOx3.  Skin warm and dry.  NAD 

## 2018-01-09 NOTE — ED Notes (Signed)
Pt came out of the BR after giving a Urine specimen and showed this nurse a bright red blood on a tissue when she wiped..Marland Kitchen

## 2018-01-09 NOTE — ED Notes (Signed)
Pt c/o bright red bloody stool with clots this morning x1. States she has a hx of hemorrhoids with surgical intervention several years ago. States she is unsure if she had anymore bloody stools during the night , states she did not look. States she has intermittent left sided abd pain with constipation, states she normally has to take something to help have a regular BM..Marland Kitchen

## 2018-01-09 NOTE — ED Provider Notes (Signed)
[facility] Emergency Department Provider Note       Time seen: ----------------------------------------- 8:14 AM on 01/09/2018 -----------------------------------------   I have reviewed the triage vital signs and the nursing notes.  HISTORY   Chief Complaint Rectal Bleeding    HPI Judith Martinez is a 32 y.o. female with a history of headaches, kidney stones, pyelonephritis and migraines who presents to the ED for rectal bleeding.  Patient reports she is passing clots of her rectum.  She reports a history of same but denies seeking treatment at that time.  She states that the blood is a mix of dark and bright red blood.  She states she had spontaneous rectal bleeding in the past that has resolved.  She has had surgery for hemorrhoids in the past.  Past Medical History:  Diagnosis Date  . Headache(784.0)    frequent  . History of kidney stones 2013  . History of pyelonephritis 2012  . Hx of migraines     Patient Active Problem List   Diagnosis Date Noted  . RLQ abdominal pain 03/29/2015  . MDD (major depressive disorder), single episode 10/14/2014  . Adjustment disorder with anxiety 02/14/2013  . ZOXWRUEA(540.9)     Past Surgical History:  Procedure Laterality Date  . BREAST ENHANCEMENT SURGERY  2012  . TONSILLECTOMY  1994  . TUBAL LIGATION  10/13    Allergies Patient has no known allergies.  Social History Social History   Tobacco Use  . Smoking status: Never Smoker  . Smokeless tobacco: Never Used  Substance Use Topics  . Alcohol use: Yes    Comment: Occasional  . Drug use: No    Review of Systems Constitutional: Negative for fever. Cardiovascular: Negative for chest pain. Respiratory: Negative for shortness of breath. Gastrointestinal: Negative for abdominal pain, vomiting and diarrhea.  Positive for rectal bleeding Musculoskeletal: Negative for back pain. Skin: Negative for rash. Neurological: Negative for headaches, focal  weakness or numbness.  All systems negative/normal/unremarkable except as stated in the HPI  ____________________________________________   PHYSICAL EXAM:  VITAL SIGNS: ED Triage Vitals [01/09/18 0810]  Enc Vitals Group     BP (!) 152/69     Pulse Rate 94     Resp 17     Temp 98 F (36.7 C)     Temp Source Oral     SpO2 99 %     Weight 160 lb (72.6 kg)     Height 5\' 6"  (1.676 m)     Head Circumference      Peak Flow      Pain Score      Pain Loc      Pain Edu?      Excl. in GC?     Constitutional: Alert and oriented. Well appearing and in no distress. Eyes: Conjunctivae are normal. Normal extraocular movements. ENT   Head: Normocephalic and atraumatic.   Nose: No congestion/rhinnorhea.   Mouth/Throat: Mucous membranes are moist.   Neck: No stridor. Cardiovascular: Normal rate, regular rhythm. No murmurs, rubs, or gallops. Respiratory: Normal respiratory effort without tachypnea nor retractions. Breath sounds are clear and equal bilaterally. No wheezes/rales/rhonchi. Gastrointestinal: Soft and nontender. Normal bowel sounds Rectal: No hemorrhoids, heme positive, no gross blood, no stool Musculoskeletal: Nontender with normal range of motion in extremities. No lower extremity tenderness nor edema. Neurologic:  Normal speech and language. No gross focal neurologic deficits are appreciated.  Skin:  Skin is warm, dry and intact. No rash noted. Psychiatric: Mood and affect are  normal. Speech and behavior are normal.  ____________________________________________  ED COURSE:  As part of my medical decision making, I reviewed the following data within the electronic MEDICAL RECORD NUMBER History obtained from family if available, nursing notes, old chart and ekg, as well as notes from prior ED visits. Patient presented for rectal bleeding, we will assess with labs and consider imaging as indicated at this time.    Procedures ____________________________________________   LABS (pertinent positives/negatives)  Labs Reviewed  CBC WITH DIFFERENTIAL/PLATELET - Abnormal; Notable for the following components:      Result Value   RDW 15.6 (*)    All other components within normal limits  COMPREHENSIVE METABOLIC PANEL - Abnormal; Notable for the following components:   Glucose, Bld 103 (*)    Total Protein 8.2 (*)    All other components within normal limits  PROTIME-INR  POC URINE PREG, ED  POCT PREGNANCY, URINE   ____________________________________________  DIFFERENTIAL DIAGNOSIS   Hemorrhoids, diverticulosis, upper GI bleeding, anal fissure, constipation  FINAL ASSESSMENT AND PLAN  Rectal bleeding   Plan: Patient had presented for rectal bleeding. Patient's labs are unremarkable.  Rectally there was no bright blood and she only had a small amount when she wiped here.  Unclear etiology, possible internal hemorrhoid versus anal fissure.  I will encourage close outpatient follow-up with GI.   Ulice DashJohnathan E Tylena Prisk, MD   Note: This note was generated in part or whole with voice recognition software. Voice recognition is usually quite accurate but there are transcription errors that can and very often do occur. I apologize for any typographical errors that were not detected and corrected.     Emily FilbertWilliams, Aanika Defoor E, MD 01/09/18 [id number]

## 2018-01-15 ENCOUNTER — Other Ambulatory Visit: Payer: Self-pay

## 2018-01-15 ENCOUNTER — Ambulatory Visit: Payer: BLUE CROSS/BLUE SHIELD | Admitting: Gastroenterology

## 2018-01-15 ENCOUNTER — Encounter: Payer: Self-pay | Admitting: Gastroenterology

## 2018-01-15 VITALS — BP 118/60 | HR 71 | Temp 97.1°F | Ht 66.0 in | Wt 163.2 lb

## 2018-01-15 DIAGNOSIS — R109 Unspecified abdominal pain: Secondary | ICD-10-CM

## 2018-01-15 DIAGNOSIS — K648 Other hemorrhoids: Secondary | ICD-10-CM | POA: Diagnosis not present

## 2018-01-15 NOTE — Patient Instructions (Signed)
F/u 3 months    High-Fiber Diet Fiber, also called dietary fiber, is a type of carbohydrate found in fruits, vegetables, whole grains, and beans. A high-fiber diet can have many health benefits. Your health care provider may recommend a high-fiber diet to help:  Prevent constipation. Fiber can make your bowel movements more regular.  Lower your cholesterol.  Relieve hemorrhoids, uncomplicated diverticulosis, or irritable bowel syndrome.  Prevent overeating as part of a weight-loss plan.  Prevent heart disease, type 2 diabetes, and certain cancers.  What is my plan? The recommended daily intake of fiber includes:  38 grams for men under age 31.  30 grams for men over age 31.  25 grams for women under age 31.  21 grams for women over age 31.  You can get the recommended daily intake of dietary fiber by eating a variety of fruits, vegetables, grains, and beans. Your health care provider may also recommend a fiber supplement if it is not possible to get enough fiber through your diet. What do I need to know about a high-fiber diet?  Fiber supplements have not been widely studied for their effectiveness, so it is better to get fiber through food sources.  Always check the fiber content on thenutrition facts label of any prepackaged food. Look for foods that contain at least 5 grams of fiber per serving.  Ask your dietitian if you have questions about specific foods that are related to your condition, especially if those foods are not listed in the following section.  Increase your daily fiber consumption gradually. Increasing your intake of dietary fiber too quickly may cause bloating, cramping, or gas.  Drink plenty of water. Water helps you to digest fiber. What foods can I eat? Grains Whole-grain breads. Multigrain cereal. Oats and oatmeal. Brown rice. Barley. Bulgur wheat. Millet. Bran muffins. Popcorn. Rye wafer crackers. Vegetables Sweet potatoes. Spinach. Kale.  Artichokes. Cabbage. Broccoli. Green peas. Carrots. Squash. Fruits Berries. Pears. Apples. Oranges. Avocados. Prunes and raisins. Dried figs. Meats and Other Protein Sources Navy, kidney, pinto, and soy beans. Split peas. Lentils. Nuts and seeds. Dairy Fiber-fortified yogurt. Beverages Fiber-fortified soy milk. Fiber-fortified orange juice. Other Fiber bars. The items listed above may not be a complete list of recommended foods or beverages. Contact your dietitian for more options. What foods are not recommended? Grains White bread. Pasta made with refined flour. White rice. Vegetables Fried potatoes. Canned vegetables. Well-cooked vegetables. Fruits Fruit juice. Cooked, strained fruit. Meats and Other Protein Sources Fatty cuts of meat. Fried Environmental education officerpoultry or fried fish. Dairy Milk. Yogurt. Cream cheese. Sour cream. Beverages Soft drinks. Other Cakes and pastries. Butter and oils. The items listed above may not be a complete list of foods and beverages to avoid. Contact your dietitian for more information. What are some tips for including high-fiber foods in my diet?  Eat a wide variety of high-fiber foods.  Make sure that half of all grains consumed each day are whole grains.  Replace breads and cereals made from refined flour or white flour with whole-grain breads and cereals.  Replace white rice with brown rice, bulgur wheat, or millet.  Start the day with a breakfast that is high in fiber, such as a cereal that contains at least 5 grams of fiber per serving.  Use beans in place of meat in soups, salads, or pasta.  Eat high-fiber snacks, such as berries, raw vegetables, nuts, or popcorn. This information is not intended to replace advice given to you by your health care  provider. Make sure you discuss any questions you have with your health care provider. Document Released: 10/30/2005 Document Revised: 04/06/2016 Document Reviewed: 04/14/2014 Elsevier Interactive Patient  Education  [employer].

## 2018-01-15 NOTE — Progress Notes (Signed)
Judith Martinez [address]  [address]  [place], [place] [address]  Main: [phone]  Fax: [phone]   Gastroenterology Consultation  Referring Provider:     Karie SchwalbeLetvak, Richard I, MD Primary Care Physician:  Karie SchwalbeLetvak, Richard I, MD Primary Gastroenterologist:  Dr. Melodie BouillonVarnita Alyria Martinez Reason for Consultation:     Bright red blood per rectum        HPI:   Judith Martinez is a 32 y.o. y/o female referred for consultation & management  by Dr. Karie SchwalbeLetvak, Richard I, MD.  Patient reports 5-6-year history of intermittent bright red blood per rectum.  Patient also reports chronic history of constipation and has tried multiple medications for this in the past, and states most of them have not worked.  She has been on MiraLAX, Metamucil consistently and states that that this does not work.  She has history of hemorrhoid surgery, was last done about 4-5 years ago at the time of her last vaginal delivery.  During her pregnancy her hemorrhoids have been the worse in the past.  She went to the ER, in the last week.  Due to blood per rectum, had a normal hemoglobin discharge with GI follow-up.  She is very active, and is in competitive sports/bodybuilding.  States she eats a lot of fruits and vegetables.  Reports mild abdominal cramping with her bowel movements at times.  No loss of appetite, no nausea vomiting, no heartburn, no dysphagia, no immediate family history of colon cancer or IBD.  She takes an over-the-counter laxative that she does not know the name of, but takes it every other day if she does not have a bowel movement and this leads to 2 loose bowel movements.  Past Medical History:  Diagnosis Date  . Headache(784.0)    frequent  . History of kidney stones 2013  . History of pyelonephritis 2012  . Hx of migraines     Past Surgical History:  Procedure Laterality Date  . BREAST ENHANCEMENT SURGERY  2012  . TONSILLECTOMY  1994  . TUBAL LIGATION  10/13    Prior to Admission  medications   Medication Sig Start Date End Date Taking? Authorizing Provider  docusate sodium (COLACE) 100 MG capsule Take 1 capsule (100 mg total) by mouth 2 (two) times daily. Patient not taking: Reported on 01/15/2018 01/09/18 01/09/19  Emily FilbertWilliams, Jonathan E, MD  senna-docusate (SENOKOT-S) 8.6-50 MG tablet Take 2 tablets by mouth at bedtime. Patient not taking: Reported on 01/15/2018 01/09/18 01/09/19  Emily FilbertWilliams, Jonathan E, MD    Family History  Problem Relation Age of Onset  . Hypertension Father   . Migraines Father   . Hyperlipidemia Father   . CAD Paternal Grandfather   . Parkinson's disease Paternal Grandfather   . Cancer Paternal Grandfather        leukemia  . Stroke Paternal Grandfather   . Cancer Sister 4       tumor in leg  . Cancer Other        maternal great grandmother; breast  . Diabetes Paternal Grandmother   . Fibromyalgia Mother      Social History   Tobacco Use  . Smoking status: Never Smoker  . Smokeless tobacco: Never Used  Substance Use Topics  . Alcohol use: No    Frequency: Never    Comment: Occasional  . Drug use: No    Allergies as of 01/15/2018  . (No Known Allergies)    Review of Systems:    All systems reviewed and negative  except where noted in HPI.   Physical Exam:  BP 118/60   Pulse 71   Temp (!) 97.1 F (36.2 C) (Oral)   Ht 5\' 6"  (1.676 m)   Wt 163 lb 3.2 oz (74 kg)   BMI 26.34 kg/m  No LMP recorded. Psych:  Alert and cooperative. Normal mood and affect. General:   Alert,  Well-developed, well-nourished, pleasant and cooperative in NAD Head:  Normocephalic and atraumatic. Eyes:  Sclera clear, no icterus.   Conjunctiva pink. Ears:  Normal auditory acuity. Nose:  No deformity, discharge, or lesions. Mouth:  No deformity or lesions,oropharynx pink & moist. Neck:  Supple; no masses or thyromegaly. Lungs:  Respirations even and unlabored.  Clear throughout to auscultation.   No wheezes, crackles, or rhonchi. No acute  distress. Heart:  Regular rate and rhythm; no murmurs, clicks, rubs, or gallops. Abdomen:  Normal bowel sounds.  No bruits.  Soft, non-tender and non-distended without masses, hepatosplenomegaly or hernias noted.  No guarding or rebound tenderness.    Rectal: Nontender external hemorrhoid noted (patient states this is been present, and has been the size it is now, since the surgery), no anal fissure noted.  Digital rectal exam did not reveal any pain, or blood. Msk:  Symmetrical without gross deformities. Good, equal movement & strength bilaterally. Pulses:  Normal pulses noted. Extremities:  No clubbing or edema.  No cyanosis. Neurologic:  Alert and oriented x3;  grossly normal neurologically. Skin:  Intact without significant lesions or rashes. No jaundice. Lymph Nodes:  No significant cervical adenopathy. Psych:  Alert and cooperative. Normal mood and affect.   Labs: CBC    Component Value Date/Time   WBC 4.5 01/09/2018 0831   RBC 5.13 01/09/2018 0831   HGB 15.1 01/09/2018 0831   HGB 13.3 06/25/2014 1336   HCT 45.9 01/09/2018 0831   HCT 39.2 06/25/2014 1336   PLT 222 01/09/2018 0831   PLT 223 06/25/2014 1336   MCV 89.5 01/09/2018 0831   MCV 88 06/25/2014 1336   MCH 29.4 01/09/2018 0831   MCHC 32.8 01/09/2018 0831   RDW 15.6 (H) 01/09/2018 0831   RDW 12.4 06/25/2014 1336   LYMPHSABS 1.0 01/09/2018 0831   LYMPHSABS 2.3 06/25/2014 1336   MONOABS 0.2 01/09/2018 0831   MONOABS 0.5 06/25/2014 1336   EOSABS 0.2 01/09/2018 0831   EOSABS 0.3 06/25/2014 1336   BASOSABS 0.1 01/09/2018 0831   BASOSABS 0.0 06/25/2014 1336   CMP     Component Value Date/Time   NA 141 01/09/2018 0831   NA 140 06/25/2014 1336   K 3.6 01/09/2018 0831   K 3.4 (L) 06/25/2014 1336   CL 108 01/09/2018 0831   CL 109 (H) 06/25/2014 1336   CO2 25 01/09/2018 0831   CO2 26 06/25/2014 1336   GLUCOSE 103 (H) 01/09/2018 0831   GLUCOSE 100 (H) 06/25/2014 1336   BUN 15 01/09/2018 0831   BUN 11 06/25/2014  1336   CREATININE 0.95 01/09/2018 0831   CREATININE 0.72 06/25/2014 1336   CALCIUM 9.7 01/09/2018 0831   CALCIUM 8.6 06/25/2014 1336   PROT 8.2 (H) 01/09/2018 0831   PROT 7.5 06/25/2014 1336   ALBUMIN 4.9 01/09/2018 0831   ALBUMIN 3.7 06/25/2014 1336   AST 39 01/09/2018 0831   AST 21 06/25/2014 1336   ALT 50 01/09/2018 0831   ALT 21 06/25/2014 1336   ALKPHOS 81 01/09/2018 0831   ALKPHOS 41 (L) 06/25/2014 1336   BILITOT 0.7 01/09/2018 0831   BILITOT  0.3 06/25/2014 1336   GFRNONAA >60 01/09/2018 0831   GFRNONAA >60 06/25/2014 1336   GFRAA >60 01/09/2018 0831   GFRAA >60 06/25/2014 1336    Imaging Studies: No results found.  Assessment and Plan:   Judith Martinez is a 32 y.o. y/o female has been referred for intermittent bright red blood per rectum for 5-6 yrs with normal hemoglobin, and history of hemorrhoids requiring hemorrhoidectomy in the past  Intermittent bright red blood per rectum is most consistent with hemorrhoids We will start on steroid suppository for 7-10 days at bedtime Educated on high-fiber diet Maintain soft stool daily Hydrate well If symptoms do not improve, or worsen, patient was asked to contact us immediately Can consider colonoscopy if symptoms do not improve with above measures No alarm symptoms present at this time to indicate urgent endoscopy.  Dr Judith Bouillon

## 2018-01-22 ENCOUNTER — Encounter: Payer: Self-pay | Admitting: Gastroenterology

## 2018-01-22 ENCOUNTER — Other Ambulatory Visit: Payer: Self-pay | Admitting: Gastroenterology

## 2018-01-22 DIAGNOSIS — R748 Abnormal levels of other serum enzymes: Secondary | ICD-10-CM

## 2018-01-22 LAB — CBC WITH DIFFERENTIAL/PLATELET
Basophils Absolute: 0 10*3/uL (ref 0.0–0.2)
Basos: 1 %
EOS (ABSOLUTE): 0.2 10*3/uL (ref 0.0–0.4)
Eos: 3 %
Hematocrit: 43.6 % (ref 34.0–46.6)
Hemoglobin: 14.9 g/dL (ref 11.1–15.9)
Immature Grans (Abs): 0 10*3/uL (ref 0.0–0.1)
Immature Granulocytes: 0 %
Lymphocytes Absolute: 1.7 10*3/uL (ref 0.7–3.1)
Lymphs: 26 %
MCH: 30.5 pg (ref 26.6–33.0)
MCHC: 34.2 g/dL (ref 31.5–35.7)
MCV: 89 fL (ref 79–97)
Monocytes Absolute: 0.3 10*3/uL (ref 0.1–0.9)
Monocytes: 5 %
Neutrophils Absolute: 4.3 10*3/uL (ref 1.4–7.0)
Neutrophils: 65 %
Platelets: 282 10*3/uL (ref 150–379)
RBC: 4.89 x10E6/uL (ref 3.77–5.28)
RDW: 14.5 % (ref 12.3–15.4)
WBC: 6.5 10*3/uL (ref 3.4–10.8)

## 2018-01-22 LAB — COMPREHENSIVE METABOLIC PANEL
ALT: 71 IU/L — ABNORMAL HIGH (ref 0–32)
AST: 44 IU/L — ABNORMAL HIGH (ref 0–40)
Albumin/Globulin Ratio: 1.9 (ref 1.2–2.2)
Albumin: 5 g/dL (ref 3.5–5.5)
Alkaline Phosphatase: 91 IU/L (ref 39–117)
BUN/Creatinine Ratio: 17 (ref 9–23)
BUN: 18 mg/dL (ref 6–20)
Bilirubin Total: 0.5 mg/dL (ref 0.0–1.2)
CO2: 21 mmol/L (ref 20–29)
Calcium: 10.1 mg/dL (ref 8.7–10.2)
Chloride: 99 mmol/L (ref 96–106)
Creatinine, Ser: 1.09 mg/dL — ABNORMAL HIGH (ref 0.57–1.00)
GFR calc Af Amer: 79 mL/min/{1.73_m2} (ref >59)
GFR calc non Af Amer: 68 mL/min/{1.73_m2} (ref >59)
Globulin, Total: 2.7 g/dL (ref 1.5–4.5)
Glucose: 81 mg/dL (ref 65–99)
Potassium: 4.1 mmol/L (ref 3.5–5.2)
Sodium: 139 mmol/L (ref 134–144)
Total Protein: 7.7 g/dL (ref 6.0–8.5)

## 2018-01-22 LAB — SEDIMENTATION RATE: Sed Rate: 4 mm/hr (ref 0–32)

## 2018-01-22 LAB — C-REACTIVE PROTEIN: CRP: <0.3 mg/L (ref 0.0–4.9)

## 2018-01-22 NOTE — Progress Notes (Signed)
Debbie please let patient know, her labwork does not show any anemia which is good. Her inflammatory markers are normal as well. This would all be consistent with there bright red blood she sees in her stool as likely being from hemorrhoids. If it continues after maintaining soft stools with a high fiber diet she should let us know.   Her liver enzymes were mildly elevated. Ask her if she takes any supplements or herbal products? Does she drink alcohol, and how much. It may also have been related to recent exercise.   I have ordered hepatitis testing through [facility], along with repeating her testing in 2 weeks, please ask her to get this done.   Her createnine/kidney function was also mildly high, tell her to hydrate well, and this will be checked with her labs in 2 weeks as well.

## 2018-01-23 ENCOUNTER — Telehealth: Payer: Self-pay | Admitting: Gastroenterology

## 2018-01-23 ENCOUNTER — Other Ambulatory Visit: Payer: Self-pay

## 2018-01-23 ENCOUNTER — Encounter: Payer: Self-pay | Admitting: Gastroenterology

## 2018-01-23 DIAGNOSIS — R748 Abnormal levels of other serum enzymes: Secondary | ICD-10-CM

## 2018-01-23 NOTE — Telephone Encounter (Signed)
Pt is calling for lab results please call pt [phone]

## 2018-01-23 NOTE — Telephone Encounter (Signed)
See results note. Pt was called.

## 2018-03-04 ENCOUNTER — Encounter: Payer: Self-pay | Admitting: Gastroenterology

## 2018-04-18 ENCOUNTER — Ambulatory Visit: Payer: BLUE CROSS/BLUE SHIELD | Admitting: Gastroenterology

## 2018-04-22 ENCOUNTER — Ambulatory Visit: Payer: BLUE CROSS/BLUE SHIELD | Admitting: Gastroenterology

## 2018-05-09 ENCOUNTER — Telehealth: Payer: Self-pay | Admitting: *Deleted

## 2018-05-09 NOTE — Telephone Encounter (Signed)
Copied from CRM [id number]. Topic: General - Other >> May 09, 2018  7:37 AM Leafy Roobinson, Norma J wrote: Reason for CRM:pt would like dr copland to return her call. Pt would like a letter to get out of competitions  for [place] [employer] and [place] [employer] a professional body building contest. If pt does not have letter she could get fine

## 2018-05-13 ENCOUNTER — Encounter: Payer: Self-pay | Admitting: Family Medicine

## 2018-05-13 NOTE — Telephone Encounter (Signed)
Can you see why Tambria needs - is she hurt? Sick? I can write her a note, but probably more helpful if I can write something like, "please excuse due to acute injury" or something like that.

## 2018-05-13 NOTE — Telephone Encounter (Signed)
Spoke with Judith Martinez.  She states she had a stomach virus (vomiting and diarrhea) for 8 days.  She states her children had it also but their's only lasted like 24 hours.

## 2018-05-13 NOTE — Telephone Encounter (Signed)
done

## 2018-05-14 NOTE — Telephone Encounter (Signed)
Judith Martinez notified by telephone that her letter is ready to be picked up at the front desk.

## 2018-05-30 ENCOUNTER — Telehealth: Payer: Self-pay | Admitting: Family Medicine

## 2018-05-30 NOTE — Telephone Encounter (Signed)
Patient scheduled a my chart appointment with Dr.Copland on 06/12/18 for f/u and lab work.  Patient's PCP is Dr.Letvak, but she hasn't seen Dr.Letvak since 2016.  Patient saw Dr.Copland the last 4 times.  [facility]  Patient said she has had several appointment with Dr.Copland and wanted to see Dr.Copland.  Please advise.

## 2018-05-30 NOTE — Telephone Encounter (Signed)
This is fine with me, I know her well.

## 2018-06-12 ENCOUNTER — Ambulatory Visit: Payer: BLUE CROSS/BLUE SHIELD | Admitting: Family Medicine

## 2018-06-13 ENCOUNTER — Ambulatory Visit: Payer: BLUE CROSS/BLUE SHIELD | Admitting: Family Medicine

## 2018-06-13 ENCOUNTER — Encounter: Payer: Self-pay | Admitting: Family Medicine

## 2018-06-13 VITALS — BP 130/90 | HR 72 | Temp 98.5°F | Ht 66.5 in | Wt 165.0 lb

## 2018-06-13 DIAGNOSIS — F4322 Adjustment disorder with anxiety: Secondary | ICD-10-CM | POA: Diagnosis not present

## 2018-06-13 DIAGNOSIS — R5383 Other fatigue: Secondary | ICD-10-CM

## 2018-06-13 DIAGNOSIS — R7401 Elevation of levels of liver transaminase levels: Secondary | ICD-10-CM

## 2018-06-13 DIAGNOSIS — F4329 Adjustment disorder with other symptoms: Secondary | ICD-10-CM

## 2018-06-13 DIAGNOSIS — R740 Nonspecific elevation of levels of transaminase and lactic acid dehydrogenase [LDH]: Secondary | ICD-10-CM

## 2018-06-13 DIAGNOSIS — Z114 Encounter for screening for human immunodeficiency virus [HIV]: Secondary | ICD-10-CM

## 2018-06-13 LAB — HEPATIC FUNCTION PANEL
ALT: 44 U/L — ABNORMAL HIGH (ref 0–35)
AST: 31 U/L (ref 0–37)
Albumin: 4.8 g/dL (ref 3.5–5.2)
Alkaline Phosphatase: 74 U/L (ref 39–117)
Bilirubin, Direct: 0.1 mg/dL (ref 0.0–0.3)
Total Bilirubin: 0.6 mg/dL (ref 0.2–1.2)
Total Protein: 7.7 g/dL (ref 6.0–8.3)

## 2018-06-13 LAB — CBC WITH DIFFERENTIAL/PLATELET
Basophils Absolute: 0.1 10*3/uL (ref 0.0–0.1)
Basophils Relative: 1.3 % (ref 0.0–3.0)
Eosinophils Absolute: 0.5 10*3/uL (ref 0.0–0.7)
Eosinophils Relative: 7.6 % — ABNORMAL HIGH (ref 0.0–5.0)
HCT: 44.3 % (ref 36.0–46.0)
Hemoglobin: 14.9 g/dL (ref 12.0–15.0)
Lymphocytes Relative: 22.1 % (ref 12.0–46.0)
Lymphs Abs: 1.6 10*3/uL (ref 0.7–4.0)
MCHC: 33.7 g/dL (ref 30.0–36.0)
MCV: 85.1 fl (ref 78.0–100.0)
Monocytes Absolute: 0.4 10*3/uL (ref 0.1–1.0)
Monocytes Relative: 5.3 % (ref 3.0–12.0)
Neutro Abs: 4.5 10*3/uL (ref 1.4–7.7)
Neutrophils Relative %: 63.7 % (ref 43.0–77.0)
Platelets: 227 10*3/uL (ref 150.0–400.0)
RBC: 5.2 Mil/uL — ABNORMAL HIGH (ref 3.87–5.11)
RDW: 15.3 % (ref 11.5–15.5)
WBC: 7 10*3/uL (ref 4.0–10.5)

## 2018-06-13 LAB — BASIC METABOLIC PANEL
BUN: 18 mg/dL (ref 6–23)
CO2: 31 mEq/L (ref 19–32)
Calcium: 10.2 mg/dL (ref 8.4–10.5)
Chloride: 101 mEq/L (ref 96–112)
Creatinine, Ser: 0.97 mg/dL (ref 0.40–1.20)
GFR: 71.19 mL/min (ref >60.00)
Glucose, Bld: 96 mg/dL (ref 70–99)
Potassium: 4.3 mEq/L (ref 3.5–5.1)
Sodium: 138 mEq/L (ref 135–145)

## 2018-06-13 LAB — T4, FREE: Free T4: 0.86 ng/dL (ref 0.60–1.60)

## 2018-06-13 LAB — T3, FREE: T3, Free: 3.5 pg/mL (ref 2.3–4.2)

## 2018-06-13 LAB — TSH: TSH: 3 u[IU]/mL (ref 0.35–4.50)

## 2018-06-13 MED ORDER — MEDROXYPROGESTERONE ACETATE 5 MG PO TABS: ORAL_TABLET | ORAL | 2 refills | Status: DC

## 2018-06-13 MED ORDER — SERTRALINE HCL 50 MG PO TABS: 50.0000 mg | ORAL_TABLET | Freq: Every day | ORAL | 3 refills | Status: DC
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
## 2018-06-14 ENCOUNTER — Other Ambulatory Visit (INDEPENDENT_AMBULATORY_CARE_PROVIDER_SITE_OTHER): Payer: BLUE CROSS/BLUE SHIELD

## 2018-06-14 DIAGNOSIS — F4329 Adjustment disorder with other symptoms: Secondary | ICD-10-CM | POA: Insufficient documentation

## 2018-06-14 DIAGNOSIS — Z13220 Encounter for screening for lipoid disorders: Secondary | ICD-10-CM

## 2018-06-14 LAB — LIPID PANEL
Cholesterol: 207 mg/dL — ABNORMAL HIGH (ref 0–200)
HDL: 93.4 mg/dL (ref >39.00)
LDL Cholesterol: 104 mg/dL — ABNORMAL HIGH (ref 0–99)
NonHDL: 113.51
Total CHOL/HDL Ratio: 2
Triglycerides: 47 mg/dL (ref 0.0–149.0)
VLDL: 9.4 mg/dL (ref 0.0–40.0)

## 2018-06-14 LAB — HEPATITIS B CORE ANTIBODY, IGM: Hep B C IgM: NONREACTIVE

## 2018-06-14 LAB — HEPATITIS B SURFACE ANTIBODY,QUALITATIVE: Hep B S Ab: REACTIVE — AB

## 2018-06-14 LAB — HEPATITIS C ANTIBODY
Hepatitis C Ab: NONREACTIVE
SIGNAL TO CUT-OFF: 0.06 (ref <1.00)

## 2018-06-14 LAB — HEPATITIS B SURFACE ANTIGEN: Hepatitis B Surface Ag: NONREACTIVE

## 2018-06-14 LAB — HIV ANTIBODY (ROUTINE TESTING W REFLEX): HIV 1&2 Ab, 4th Generation: NONREACTIVE

## 2018-07-19 ENCOUNTER — Ambulatory Visit: Payer: BLUE CROSS/BLUE SHIELD | Admitting: Family Medicine

## 2018-07-19 ENCOUNTER — Ambulatory Visit: Payer: Self-pay

## 2018-07-19 NOTE — Telephone Encounter (Signed)
Patient called in with c/o "cut hand, numbness." She says "my dishwasher was being fixed and a knife that was in the dishwasher went through my left hand about 3/4" deep. It didn't go all the way through to the other side. It was bleeding, but I got it stopped. The knife was dirty. I have it wrapped in gauze. I have pain and numbness to my hand to my wrist. This happened about 1 hour ago." I asked about other symptoms, she denies. According to protocol, see PCP within 4 hours, no availability with PCP or any provider in the practice. I asked the patient if she would be willing to go to another  location, ED, or UC, she agreed to another location. Appointment scheduled today at 1600 with Leanora Cover, FNP at [facility], care advice given, patient verbalized understanding.   Reason for Disposition . Puncture wound from a sharp object that was very dirty  Answer Assessment - Initial Assessment Questions 1. LOCATION: "Where is the puncture located?"      Left hand, palm 2. OBJECT: "What was the object that punctured the skin?"      Steak knife 3. DEPTH: "How deep do you think the puncture goes?"      3/4" deep 4. ONSET: "When did the injury occur?" (Minutes or hours)     1 hour ago 5. PAIN: "Is it painful?" If so, ask: "How bad is the pain?"  (Scale 1-10; or mild, moderate, severe)     6-7 from wrist to top of hand 6. TETANUS: "When was the last tetanus booster?"     I don't remember 7. PREGNANCY: "Is there any chance you are pregnant?" "When was your last menstrual period?"     Yes, not on BC; LMP 1 month ago  Protocols used: PUNCTURE WOUND-A-AH

## 2018-07-19 NOTE — Telephone Encounter (Signed)
Patient was called and informed to go to urgent care due to knowing if puncture would need stitches. Patient verbalized understanding and states that she will go to Urgent care.

## 2018-07-22 ENCOUNTER — Encounter: Payer: Self-pay | Admitting: Family Medicine

## 2018-07-22 NOTE — Telephone Encounter (Signed)
Can you schedule her an appt for wed? Thurs also ok?  It is already after 3 - it sounds like this has been going on for a few days.
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
## 2018-07-29 ENCOUNTER — Encounter: Payer: Self-pay | Admitting: Family Medicine

## 2018-07-29 ENCOUNTER — Ambulatory Visit: Payer: BLUE CROSS/BLUE SHIELD | Admitting: Family Medicine

## 2018-07-29 VITALS — BP 112/68 | HR 58 | Temp 98.6°F | Ht 66.5 in | Wt 169.8 lb

## 2018-07-29 DIAGNOSIS — F4010 Social phobia, unspecified: Secondary | ICD-10-CM

## 2018-07-29 DIAGNOSIS — F410 Panic disorder [episodic paroxysmal anxiety] without agoraphobia: Secondary | ICD-10-CM

## 2018-07-29 DIAGNOSIS — R29898 Other symptoms and signs involving the musculoskeletal system: Secondary | ICD-10-CM | POA: Diagnosis not present

## 2018-07-29 DIAGNOSIS — R200 Anesthesia of skin: Secondary | ICD-10-CM

## 2018-07-29 DIAGNOSIS — T148XXA Other injury of unspecified body region, initial encounter: Secondary | ICD-10-CM

## 2018-07-29 DIAGNOSIS — R202 Paresthesia of skin: Secondary | ICD-10-CM

## 2018-07-29 MED ORDER — BUSPIRONE HCL 10 MG PO TABS: 10.0000 mg | ORAL_TABLET | Freq: Two times a day (BID) | ORAL | 3 refills | Status: DC

## 2018-07-29 NOTE — Patient Instructions (Addendum)
1/2 tablet twice a day for 1 week, then increase to 1 tablet twice a day.

## 2018-07-30 ENCOUNTER — Ambulatory Visit: Payer: BLUE CROSS/BLUE SHIELD | Admitting: Neurology

## 2018-07-30 ENCOUNTER — Encounter: Payer: Self-pay | Admitting: Family Medicine

## 2018-07-30 ENCOUNTER — Encounter: Payer: Self-pay | Admitting: Neurology

## 2018-07-30 VITALS — BP 121/68 | HR 70 | Ht 66.0 in | Wt 166.0 lb

## 2018-07-30 DIAGNOSIS — S5402XA Injury of ulnar nerve at forearm level, left arm, initial encounter: Secondary | ICD-10-CM

## 2018-07-30 DIAGNOSIS — S6992XA Unspecified injury of left wrist, hand and finger(s), initial encounter: Secondary | ICD-10-CM

## 2018-07-30 DIAGNOSIS — G5603 Carpal tunnel syndrome, bilateral upper limbs: Secondary | ICD-10-CM | POA: Diagnosis not present

## 2018-07-30 DIAGNOSIS — S5400XA Injury of ulnar nerve at forearm level, unspecified arm, initial encounter: Secondary | ICD-10-CM

## 2018-07-30 DIAGNOSIS — F4010 Social phobia, unspecified: Secondary | ICD-10-CM | POA: Insufficient documentation

## 2018-07-30 DIAGNOSIS — F39 Unspecified mood [affective] disorder: Secondary | ICD-10-CM | POA: Insufficient documentation

## 2018-07-30 DIAGNOSIS — F410 Panic disorder [episodic paroxysmal anxiety] without agoraphobia: Secondary | ICD-10-CM

## 2018-07-30 HISTORY — DX: Panic disorder (episodic paroxysmal anxiety): F41.0

## 2018-07-30 NOTE — Patient Instructions (Signed)
Referral to a peripheral nerve surgeon for the left ulnar palmar injury (Ulnar Nerve) EMG/NCS(See Below) to examine Median Nerve (Carpal Tunnel)   Electromyoneurogram Electromyoneurogram is a test to check how well your muscles and nerves are working. This procedure includes the combined use of electromyogram (EMG) and nerve conduction study (NCS). EMG is used to look for muscular disorders. NCS, which is also called electroneurogram, measures how well your nerves are controlling your muscles. The procedures are usually performed together to check if your muscles and nerves are healthy. If the reaction to testing is abnormal, this can indicate disease or injury, such as peripheral nerve damage. Tell a health care provider about:  Any allergies you have.  All medicines you are taking, including vitamins, herbs, eye drops, creams, and over-the-counter medicines.  Any problems you or family members have had with anesthetic medicines.  Any blood disorders you have.  Any surgeries you have had.  Any medical conditions you have.  Any pacemaker you have. What are the risks? Generally, this is a safe procedure. However, problems may occur, including:  Infection where the electrodes were inserted.  Bleeding.  What happens before the procedure?  Ask your health care provider about: ? Changing or stopping your regular medicines. This is especially important if you are taking diabetes medicines or blood thinners. ? Taking medicines such as aspirin and ibuprofen. These medicines can thin your blood. Do not take these medicines before your procedure if your health care provider instructs you not to.  Your health care provider may ask you to avoid: ? Caffeine, such as coffee and tea. ? Nicotine. This includes cigarettes and anything with tobacco.  Do not use lotions or creams on the same day that you will be having the procedure. What happens during the procedure? For EMG:  Your health  care provider will ask you to stay in a position so that he or she can access the muscle that will be studied. You may be standing, sitting down, or lying down.  You may be given a medicine that numbs the area (local anesthetic).  A very thin needle that has an electrode on it will be inserted into your muscle.  Another small electrode will be placed on your skin near the muscle.  Your health care provider will ask you to continue to remain still.  The electrodes will send a signal that tells about the electrical activity of your muscles. You may see this on a monitor or hear it in the room.  After your muscles have been studied at rest, your health care provider will ask you to contract or flex your muscles. The electrodes will send a signal that tells about the electrical activity of your muscles.  Your health care provider will remove the electrodes and the electrode needles when the procedure is finished. The procedure may vary among health care providers and hospitals. For NCS:  An electrode that records your nerve activity (recording electrode) will be placed on your skin by the muscle that is being studied.  An electrode that is used as a reference (reference electrode) will be placed near the recording electrode.  A paste or gel will be applied to your skin between the recording electrode and the reference electrode.  Your nerve will be stimulated with a mild shock. Your health care provider will measure how much time it takes for your muscle to react.  Your health care provider will remove the electrodes and the gel when the procedure is finished.  The procedure may vary among health care providers and hospitals. What happens after the procedure?  It is your responsibility to obtain your test results. Ask your health care provider or the department performing the test when and how you will get your results.  Your health care provider may: ? Give you medicines for any  pain. ? Monitor the insertion sites to make sure that they stop bleeding. This information is not intended to replace advice given to you by your health care provider. Make sure you discuss any questions you have with your health care provider. Document Released: 03/02/2005 Document Revised: 04/06/2016 Document Reviewed: 12/21/2014 Elsevier Interactive Patient Education  2018 Elsevier Inc.   Carpal Tunnel Syndrome Carpal tunnel syndrome is a condition that causes pain in your hand and arm. The carpal tunnel is a narrow area that is on the palm side of your wrist. Repeated wrist motion or certain diseases may cause swelling in the tunnel. This swelling can pinch the main nerve in the wrist (median nerve). Follow these instructions at home: If you have a splint:  Wear it as told by your doctor. Remove it only as told by your doctor.  Loosen the splint if your fingers: ? Become numb and tingle. ? Turn blue and cold.  Keep the splint clean and dry. General instructions  Take over-the-counter and prescription medicines only as told by your doctor.  Rest your wrist from any activity that may be causing your pain. If needed, talk to your employer about changes that can be made in your work, such as getting a wrist pad to use while typing.  If directed, apply ice to the painful area: ? Put ice in a plastic bag. ? Place a towel between your skin and the bag. ? Leave the ice on for 20 minutes, 2-3 times per day.  Keep all follow-up visits as told by your doctor. This is important.  Do any exercises as told by your doctor, physical therapist, or occupational therapist. Contact a doctor if:  You have new symptoms.  Medicine does not help your pain.  Your symptoms get worse. This information is not intended to replace advice given to you by your health care provider. Make sure you discuss any questions you have with your health care provider. Document Released: 10/19/2011 Document  Revised: 04/06/2016 Document Reviewed: 03/17/2015 Elsevier Interactive Patient Education  [employer]2018 Elsevier Inc.
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
## 2018-07-31 ENCOUNTER — Ambulatory Visit (INDEPENDENT_AMBULATORY_CARE_PROVIDER_SITE_OTHER)
Admission: RE | Admit: 2018-07-31 | Discharge: 2018-07-31 | Disposition: A | Discharge status: Home / Self Care | Payer: BLUE CROSS/BLUE SHIELD | Source: Ambulatory Visit | Attending: Family Medicine | Admitting: Family Medicine

## 2018-07-31 ENCOUNTER — Encounter: Payer: Self-pay | Admitting: Neurology

## 2018-07-31 ENCOUNTER — Encounter: Payer: Self-pay | Admitting: Family Medicine

## 2018-07-31 ENCOUNTER — Ambulatory Visit: Payer: BLUE CROSS/BLUE SHIELD | Admitting: Family Medicine

## 2018-07-31 VITALS — BP 120/80 | HR 55 | Temp 98.3°F | Ht 66.5 in | Wt 166.5 lb

## 2018-07-31 DIAGNOSIS — M84361A Stress fracture, right tibia, initial encounter for fracture: Secondary | ICD-10-CM

## 2018-07-31 DIAGNOSIS — M79604 Pain in right leg: Secondary | ICD-10-CM | POA: Diagnosis not present

## 2018-07-31 DIAGNOSIS — G43001 Migraine without aura, not intractable, with status migrainosus: Secondary | ICD-10-CM

## 2018-07-31 DIAGNOSIS — S5402XA Injury of ulnar nerve at forearm level, left arm, initial encounter: Secondary | ICD-10-CM | POA: Insufficient documentation

## 2018-07-31 MED ORDER — BUTALBITAL-APAP-CAFFEINE 50-325-40 MG PO TABS: 1.0000 | ORAL_TABLET | Freq: Four times a day (QID) | ORAL | 1 refills | Status: DC | PRN
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
## 2018-07-31 NOTE — Telephone Encounter (Signed)
ASAP appointment today, please

## 2018-08-01 ENCOUNTER — Ambulatory Visit: Payer: BLUE CROSS/BLUE SHIELD | Admitting: Neurology

## 2018-08-01 ENCOUNTER — Encounter: Payer: Self-pay | Admitting: Family Medicine

## 2018-08-01 ENCOUNTER — Ambulatory Visit (INDEPENDENT_AMBULATORY_CARE_PROVIDER_SITE_OTHER): Payer: BLUE CROSS/BLUE SHIELD | Admitting: Neurology

## 2018-08-01 DIAGNOSIS — G5601 Carpal tunnel syndrome, right upper limb: Secondary | ICD-10-CM

## 2018-08-01 DIAGNOSIS — G43009 Migraine without aura, not intractable, without status migrainosus: Secondary | ICD-10-CM | POA: Insufficient documentation

## 2018-08-01 DIAGNOSIS — S5402XA Injury of ulnar nerve at forearm level, left arm, initial encounter: Secondary | ICD-10-CM | POA: Diagnosis not present

## 2018-08-01 DIAGNOSIS — Z0289 Encounter for other administrative examinations: Secondary | ICD-10-CM

## 2018-08-01 DIAGNOSIS — S5402XS Injury of ulnar nerve at forearm level, left arm, sequela: Secondary | ICD-10-CM

## 2018-08-01 NOTE — Progress Notes (Signed)
See procedure note.

## 2018-08-05 ENCOUNTER — Ambulatory Visit: Payer: BLUE CROSS/BLUE SHIELD | Admitting: Family Medicine
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
## 2018-08-05 NOTE — Addendum Note (Signed)
Addended by: Naomie DeanAHERN, Jeanmarie Mccowen B on: 08/05/2018 01:01 PM   Modules accepted: Level of Service
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
## 2018-08-22 ENCOUNTER — Encounter: Payer: Self-pay | Admitting: Family Medicine

## 2018-08-28 ENCOUNTER — Ambulatory Visit: Payer: BLUE CROSS/BLUE SHIELD | Admitting: Family Medicine

## 2018-08-28 DIAGNOSIS — Z0289 Encounter for other administrative examinations: Secondary | ICD-10-CM

## 2018-09-09 ENCOUNTER — Encounter: Payer: Self-pay | Admitting: Family Medicine

## 2018-09-17 ENCOUNTER — Encounter: Payer: Self-pay | Admitting: Family Medicine

## 2018-12-12 ENCOUNTER — Encounter: Payer: Self-pay | Admitting: Family Medicine

## 2018-12-12 NOTE — Telephone Encounter (Signed)
Can you help set her up an appointment. I could pretty easily see her on Monday.

## 2019-02-27 IMAGING — DX DG TIBIA/FIBULA 2V*R*
2 series · 2 of 2 positions shown · non-contrast
Comparison: None.

CLINICAL DATA: Right leg pain and concern for tibial stress
fracture.

EXAM:
RIGHT TIBIA AND FIBULA - 2 VIEW

[tibia ap]
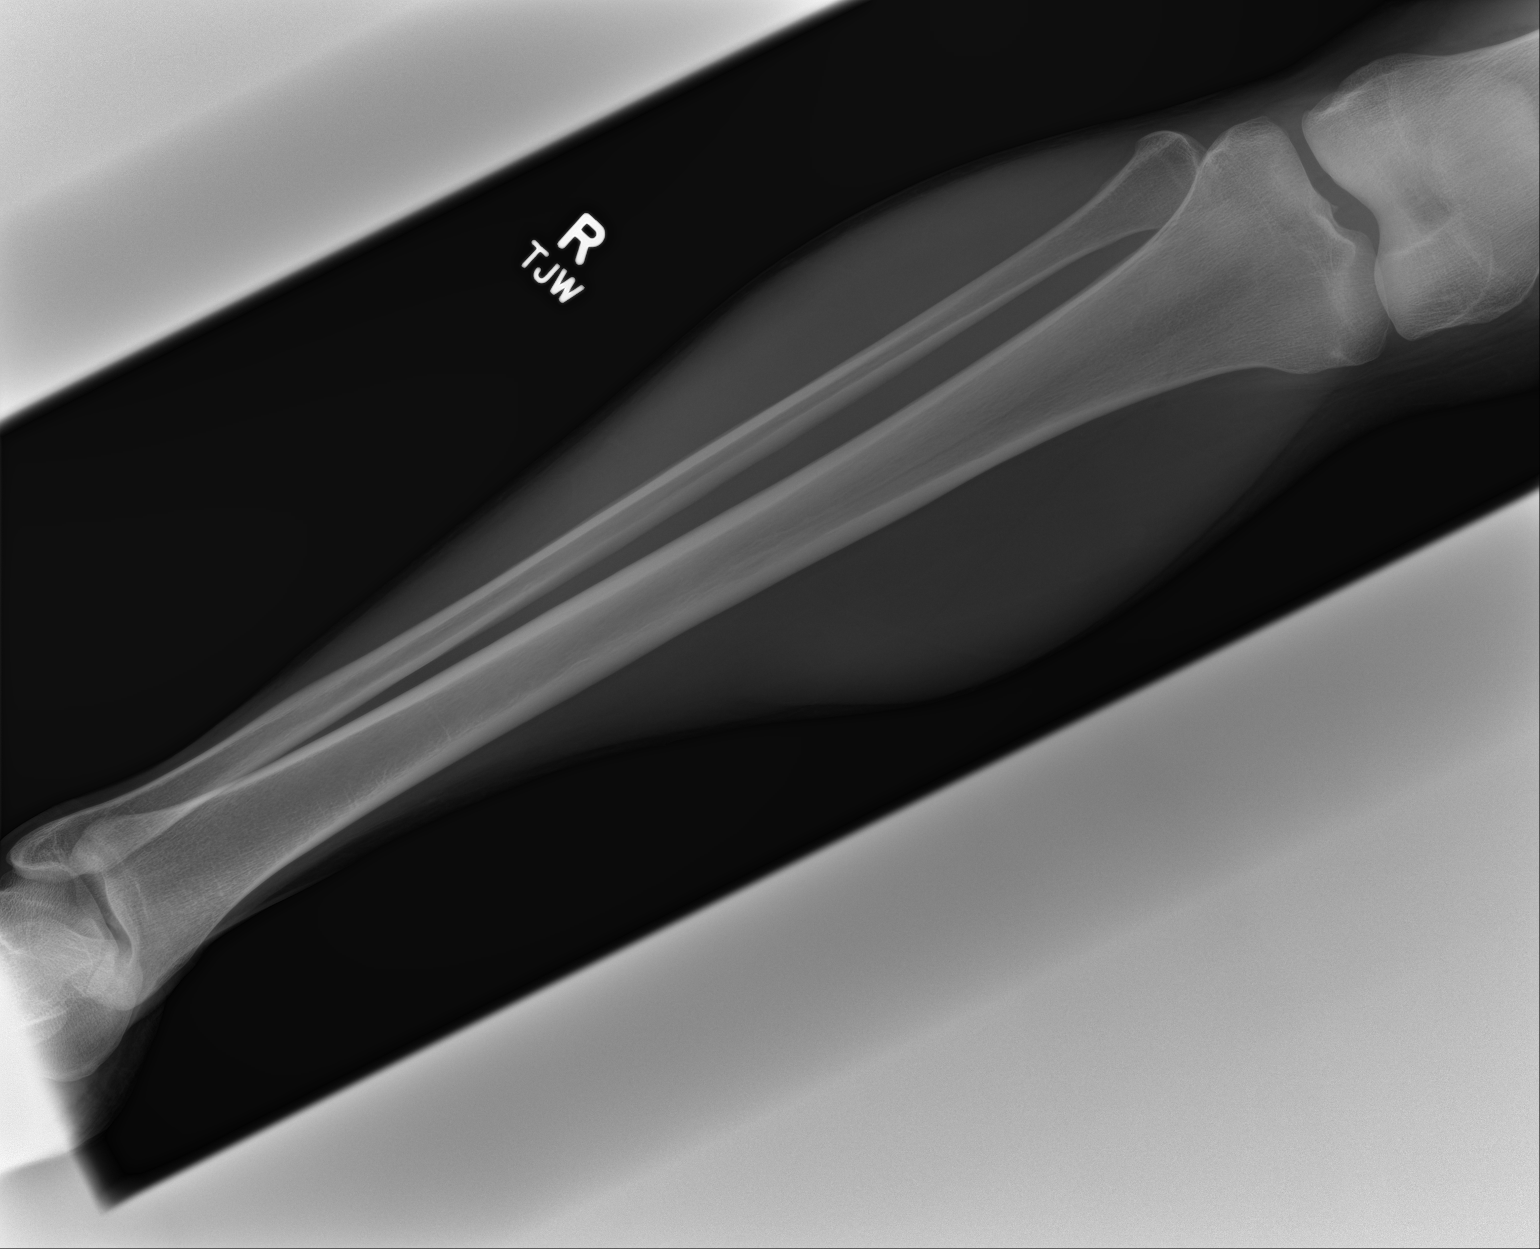

[tibia lat]
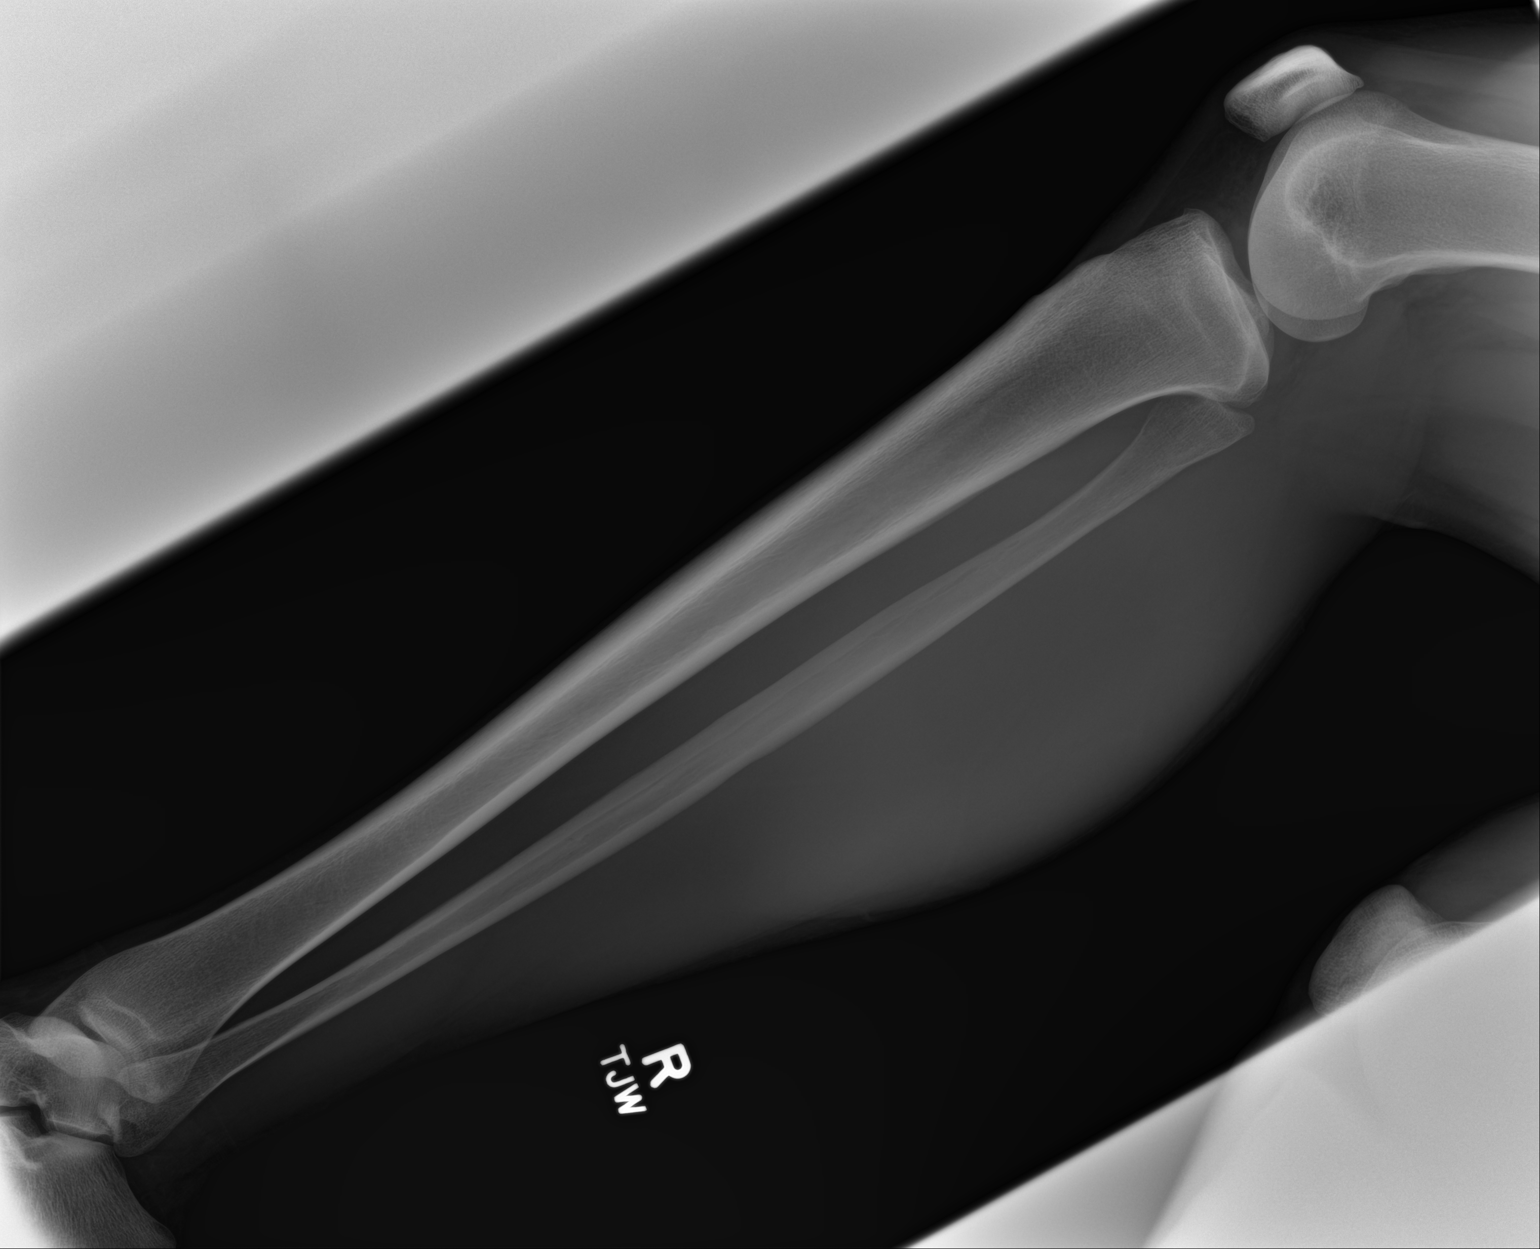

[2 of 2 positions shown; findings below may reference images not displayed]

FINDINGS: Two views of the right tibia and fibula were obtained. Negative for
a fracture. No gross abnormality to the knee or ankle. There is no
evidence for periosteal reaction or a bone lesion. There is a subtle
linear density involving the distal diaphysis of the tibia and most
compatible with a growth arrest line rather than a stress reaction.
IMPRESSION: Normal appearance of the right tibia and fibula.

## 2019-03-24 ENCOUNTER — Encounter: Payer: Self-pay | Admitting: Family Medicine

## 2019-03-24 ENCOUNTER — Other Ambulatory Visit: Payer: Self-pay

## 2019-03-24 ENCOUNTER — Ambulatory Visit: Payer: Self-pay | Admitting: Family Medicine

## 2019-03-24 VITALS — BP 120/80 | HR 64 | Temp 98.9°F | Ht 66.5 in | Wt 171.2 lb

## 2019-03-24 DIAGNOSIS — K130 Diseases of lips: Secondary | ICD-10-CM

## 2019-03-24 DIAGNOSIS — L02429 Furuncle of limb, unspecified: Secondary | ICD-10-CM

## 2019-03-24 MED ORDER — CEPHALEXIN 500 MG PO CAPS: 1000.0000 mg | ORAL_CAPSULE | Freq: Two times a day (BID) | ORAL | 0 refills | Status: AC

## 2019-03-24 NOTE — Progress Notes (Signed)
     Judith Martinez T. Seyed Heffley, MD Primary Care and Sports Medicine [facility] [address] [place], [address] Phone: [phone]  FAX: [phone]  Judith Martinez - 33 y.o. female  MRN [id number]  Date of Birth: [birth year]  Visit Date: 03/24/2019  PCP: Hannah Beat, MD  Referred by: Hannah Beat, MD  Chief Complaint  Patient presents with  . Sores on Legs   Subjective:   Judith Martinez is a 33 y.o. very pleasant female patient who presents with the following:  She is a nice lady, and she called me earlier she had some painful sores on her legs.  So I brought her in the office to take a look at them.  She is not having any systemic fever.  She has one on her finger and then a few very small ones then 2 larger ones on her legs.  She also has angular  She also dropped a weight on her foot recently and also did some squats then hurt her back yesterday.  Angular chelititis  abx -   Medication list reviewed and updated in full in  Link.  Objective:   BP 120/80   Pulse 64   Temp 98.9 F (37.2 C) (Oral)   Ht 5' 6.5" (1.689 m)   Wt 171 lb 4 oz (77.7 kg)   LMP 03/10/2019   BMI 27.23 kg/m   GEN: WDWN, NAD, Non-toxic, A & O x 3 HEENT: Atraumatic, Normocephalic. Neck supple. No masses, No LAD. Ears and Nose: No external deformity. EXTR: No c/c/e NEURO Normal gait.  PSYCH: Normally interactive. Conversant. Not depressed or anxious appearing.  Calm demeanor.       Skin changes as above, mild tenderness at the largest  Laboratory and Imaging Data:  Assessment and Plan:   Boil, leg  Angular cheilitis  abx Stop shaving for now  clortimazole for ang chel  Follow-up: No follow-ups on file.  Meds ordered this encounter  Medications  . cephALEXin (KEFLEX) 500 MG capsule    Sig: Take 2 capsules (1,000 mg total) by mouth 2 (two) times daily for 10 days.    Dispense:  40 capsule    Refill:  0   No orders of the  defined types were placed in this encounter.   Signed,  Judith Martinez. Judith Bal, MD   Outpatient Encounter Medications as of 03/24/2019  Medication Sig  . [DISCONTINUED] butalbital-acetaminophen-caffeine (FIORICET) 50-325-40 MG tablet Take 1 tablet by mouth every 6 (six) hours as needed for headache.  . cephALEXin (KEFLEX) 500 MG capsule Take 2 capsules (1,000 mg total) by mouth 2 (two) times daily for 10 days.  . [DISCONTINUED] busPIRone (BUSPAR) 10 MG tablet Take 1 tablet (10 mg total) by mouth 2 (two) times daily. (Patient not taking: Reported on 07/30/2018)   No facility-administered encounter medications on file as of 03/24/2019.

## 2019-03-24 NOTE — Telephone Encounter (Signed)
Please help me to make sure she follows-up in the office

## 2019-06-02 ENCOUNTER — Encounter: Payer: Self-pay | Admitting: Family Medicine

## 2019-06-02 ENCOUNTER — Encounter: Payer: Self-pay | Admitting: Emergency Medicine

## 2019-06-02 ENCOUNTER — Emergency Department: Payer: BLUE CROSS/BLUE SHIELD

## 2019-06-02 ENCOUNTER — Emergency Department
Admission: EM | Admit: 2019-06-02 | Discharge: 2019-06-02 | Disposition: A | Discharge status: Home / Self Care | Payer: BLUE CROSS/BLUE SHIELD | Attending: Emergency Medicine | Admitting: Emergency Medicine

## 2019-06-02 ENCOUNTER — Other Ambulatory Visit: Payer: Self-pay

## 2019-06-02 DIAGNOSIS — Z3A00 Weeks of gestation of pregnancy not specified: Secondary | ICD-10-CM | POA: Insufficient documentation

## 2019-06-02 DIAGNOSIS — R102 Pelvic and perineal pain: Secondary | ICD-10-CM | POA: Diagnosis not present

## 2019-06-02 DIAGNOSIS — O4690 Antepartum hemorrhage, unspecified, unspecified trimester: Secondary | ICD-10-CM | POA: Diagnosis not present

## 2019-06-02 DIAGNOSIS — O209 Hemorrhage in early pregnancy, unspecified: Secondary | ICD-10-CM | POA: Diagnosis present

## 2019-06-02 LAB — COMPREHENSIVE METABOLIC PANEL
ALT: 31 U/L (ref 0–44)
AST: 25 U/L (ref 15–41)
Albumin: 4.8 g/dL (ref 3.5–5.0)
Alkaline Phosphatase: 52 U/L (ref 38–126)
Anion gap: 9 (ref 5–15)
BUN: 16 mg/dL (ref 6–20)
CO2: 24 mmol/L (ref 22–32)
Calcium: 9.9 mg/dL (ref 8.9–10.3)
Chloride: 107 mmol/L (ref 98–111)
Creatinine, Ser: 0.85 mg/dL (ref 0.44–1.00)
GFR calc Af Amer: >60 mL/min (ref >60)
GFR calc non Af Amer: >60 mL/min (ref >60)
Glucose, Bld: 111 mg/dL — ABNORMAL HIGH (ref 70–99)
Potassium: 3.7 mmol/L (ref 3.5–5.1)
Sodium: 140 mmol/L (ref 135–145)
Total Bilirubin: 0.6 mg/dL (ref 0.3–1.2)
Total Protein: 7.9 g/dL (ref 6.5–8.1)

## 2019-06-02 LAB — CBC
HCT: 44.3 % (ref 36.0–46.0)
Hemoglobin: 14.4 g/dL (ref 12.0–15.0)
MCH: 28.6 pg (ref 26.0–34.0)
MCHC: 32.5 g/dL (ref 30.0–36.0)
MCV: 87.9 fL (ref 80.0–100.0)
Platelets: 286 10*3/uL (ref 150–400)
RBC: 5.04 MIL/uL (ref 3.87–5.11)
RDW: 14.3 % (ref 11.5–15.5)
WBC: 5.6 10*3/uL (ref 4.0–10.5)
nRBC: 0 % (ref 0.0–0.2)

## 2019-06-02 LAB — URINALYSIS, COMPLETE (UACMP) WITH MICROSCOPIC
Bacteria, UA: NONE SEEN
Bilirubin Urine: NEGATIVE
Glucose, UA: NEGATIVE mg/dL
Hgb urine dipstick: NEGATIVE
Ketones, ur: 5 mg/dL — AB
Leukocytes,Ua: NEGATIVE
Nitrite: NEGATIVE
Protein, ur: NEGATIVE mg/dL
Specific Gravity, Urine: 1.015 (ref 1.005–1.030)
pH: 7 (ref 5.0–8.0)

## 2019-06-02 LAB — HCG, QUANTITATIVE, PREGNANCY: hCG, Beta Chain, Quant, S: 47 m[IU]/mL — ABNORMAL HIGH (ref <5)

## 2019-06-02 LAB — ANTIBODY SCREEN: Antibody Screen: NEGATIVE

## 2019-06-02 LAB — ABO/RH: ABO/RH(D): O NEG

## 2019-06-02 LAB — LIPASE, BLOOD: Lipase: 35 U/L (ref 11–51)

## 2019-06-02 MED ORDER — RHO D IMMUNE GLOBULIN 1500 UNIT/2ML IJ SOSY
300.0000 ug | PREFILLED_SYRINGE | Freq: Once | INTRAMUSCULAR | Status: AC
  Administered 2019-06-02: 300 ug via INTRAMUSCULAR
  Filled 2019-06-02: qty 2

## 2019-06-02 NOTE — ED Notes (Signed)
Patient transported to Ultrasound 

## 2019-06-02 NOTE — ED Notes (Signed)
Pt reports that 10d ago she started bleeding - she has not had a period since last year - she decided to take a pregnancy test and it was positive this weekend - pt now has nausea, bloating, diarrhea (x8), headache, general malaise, and shortness of breath (d/t being anxious per pt) - PCP is concerned about tubal pregnancy d/t tubal reversal 4+ years ago - only pain is across lower back and in breast area BUT pt had breast augmentation 7 weeks ago

## 2019-06-02 NOTE — Telephone Encounter (Signed)
Patient advised by telephone that she needs to go to the ER as instructed and she verbalized understanding. Patient stated that she with go to [facility] ER.

## 2019-06-02 NOTE — ED Triage Notes (Signed)
Says she started vaginal bleeding started about 10 days ago after 8 months without, so she took preg test and it was positive.  She continues to bleed and is worried about an ectopic so pcp sent here here.  She dies not have pain, just feels bloated.

## 2019-06-02 NOTE — Discharge Instructions (Addendum)
As we discussed you will need a repeat ultrasound in about 2 weeks, feel free to return to the emergency department anytime if your symptoms change or worsen

## 2019-06-02 NOTE — ED Notes (Signed)
Blood bank notified of need for rhogam

## 2019-06-02 NOTE — ED Provider Notes (Addendum)
[facility] Emergency Department Provider Note   ____________________________________________    I have reviewed the triage vital signs and the nursing notes.   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  She was surprised by this because she has been trying for 5 years with her husband, she did have a tubal reversal performed 4-1/2 years ago.  Does describe some nausea.  Vague pelvic cramping.  No fevers or chills.  No cough  Past Medical History:  Diagnosis Date  . History of kidney stones 2013  . History of pyelonephritis 2012  . Hx of migraines     Patient Active Problem List   Diagnosis Date Noted  . Migraine headache without aura 08/01/2018  . Ulnar nerve damage, left, initial encounter 07/31/2018  . Panic attacks 07/30/2018  . Social anxiety disorder 07/30/2018    Past Surgical History:  Procedure Laterality Date  . BREAST ENHANCEMENT SURGERY  2012  . TONSILLECTOMY  1994  . TUBAL LIGATION  10/13  . tubal reversal  2016    Prior to Admission medications   Not on File     Allergies Patient has no known allergies.  Family History  Problem Relation Age of Onset  . Hypertension Father   . Migraines Father   . Hyperlipidemia Father   . CAD Paternal Grandfather   . Parkinson's disease Paternal Grandfather   . Cancer Paternal Grandfather        leukemia  . Stroke Paternal Grandfather   . Cancer Sister 4       tumor in leg  . Cancer Other        maternal great grandmother; breast  . Diabetes Paternal Grandmother   . Fibromyalgia Mother     Social History Social History   Tobacco Use  . Smoking status: Never Smoker  . Smokeless tobacco: Never Used  Substance Use Topics  . Alcohol use: Yes    Frequency: Never    Comment: Occasional  . Drug use: No    Review of Systems  Constitutional: No fever/chills Eyes: No visual changes.  ENT: No sore throat. Cardiovascular: Denies chest pain. Respiratory: Denies shortness of breath. Gastrointestinal: As above Genitourinary: Negative for dysuria.  As above Musculoskeletal: Negative for back pain. Skin: Negative for rash. Neurological: Mild headache   ____________________________________________   PHYSICAL EXAM:  VITAL SIGNS: ED Triage Vitals  Enc Vitals Group     BP 06/02/19 1010 132/79     Pulse Rate 06/02/19 1010 91     Resp 06/02/19 1010 16     Temp 06/02/19 1010 98.5 F (36.9 C)     Temp Source 06/02/19 1010 Oral     SpO2 --      Weight 06/02/19 1011 73.5 kg (162 lb)     Height 06/02/19 1011 1.676 m (5\' 6" )     Head Circumference --      Peak Flow --      Pain Score 06/02/19 1011 8     Pain Loc --      Pain Edu? --      Excl. in [place]? --     Constitutional: Alert and oriented.  Eyes: Conjunctivae are normal.   Nose: No congestion/rhinnorhea. Mouth/Throat: Mucous membranes are moist.  Cardiovascular: Normal rate, regular rhythm.  Good peripheral circulation. Respiratory: Normal respiratory effort.  No retractions. Gastrointestinal: Soft and nontender. No distention. .  Musculoskeletal:   Warm and well perfused Neurologic:  Normal speech and language. No gross focal neurologic deficits are appreciated.  Skin:  Skin is warm, dry and intact. No rash noted. Psychiatric: Mood and affect are normal. Speech and behavior are normal.  ____________________________________________   LABS (all labs ordered are listed, but only abnormal results are displayed)  Labs Reviewed  COMPREHENSIVE METABOLIC PANEL - Abnormal; Notable for the following components:      Result Value   Glucose, Bld 111 (*)    All other components within normal limits  URINALYSIS, COMPLETE (UACMP) WITH MICROSCOPIC - Abnormal; Notable for  the following components:   Color, Urine YELLOW (*)    APPearance CLEAR (*)    Ketones, ur 5 (*)    All other components within normal limits  HCG, QUANTITATIVE, PREGNANCY - Abnormal; Notable for the following components:   hCG, Beta Chain, Quant, S 47 (*)    All other components within normal limits  LIPASE, BLOOD  CBC  ABO/RH  ANTIBODY SCREEN  RHOGAM INJECTION   ____________________________________________  EKG   ____________________________________________  RADIOLOGY  Ultrasound ____________________________________________   PROCEDURES  Procedure(s) performed: No  Procedures   Critical Care performed: No ____________________________________________   INITIAL IMPRESSION / ASSESSMENT AND PLAN / ED COURSE  Pertinent labs & imaging results that were available during my care of the patient were reviewed by me and considered in my medical decision making (see chart for details).  Patient presents with vaginal bleeding, unclear gestational age pending hCG, ABO Rh, ultrasound  Patient is RH - will order rhogam   Ultrasound most consistent with very early pregnancy she will require repeat ultrasound in 10 to 14 days [facility]    ____________________________________________   FINAL CLINICAL IMPRESSION(S) / ED DIAGNOSES  Final diagnoses:  Vaginal bleeding in pregnancy        Note:  This document was prepared using Dragon voice recognition software and may include unintentional dictation errors.   Jene EveryKinner, Marlissa Emerick, MD 06/02/19 Maebelle [id number]    Jene EveryKinner, Suraya Vidrine, MD 06/02/19 1426

## 2019-06-02 NOTE — Telephone Encounter (Signed)
Potentially emergent medical condition, and needs ER evaluation.

## 2019-06-02 NOTE — ED Notes (Signed)
Blood bank called and will need a call back if provider decides to given Rhogam

## 2019-06-03 LAB — RHOGAM INJECTION: Unit division: 0

## 2019-06-04 ENCOUNTER — Encounter: Payer: BLUE CROSS/BLUE SHIELD | Admitting: Obstetrics & Gynecology

## 2019-06-04 DIAGNOSIS — O209 Hemorrhage in early pregnancy, unspecified: Secondary | ICD-10-CM | POA: Insufficient documentation

## 2019-09-25 ENCOUNTER — Encounter: Payer: Self-pay | Admitting: Family Medicine
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
## 2019-09-29 ENCOUNTER — Ambulatory Visit (INDEPENDENT_AMBULATORY_CARE_PROVIDER_SITE_OTHER): Payer: BLUE CROSS/BLUE SHIELD | Admitting: Family Medicine

## 2019-09-29 ENCOUNTER — Ambulatory Visit (INDEPENDENT_AMBULATORY_CARE_PROVIDER_SITE_OTHER)
Admission: RE | Admit: 2019-09-29 | Discharge: 2019-09-29 | Disposition: A | Discharge status: Home / Self Care | Payer: BLUE CROSS/BLUE SHIELD | Source: Ambulatory Visit | Attending: Family Medicine | Admitting: Family Medicine

## 2019-09-29 ENCOUNTER — Other Ambulatory Visit: Payer: Self-pay

## 2019-09-29 ENCOUNTER — Encounter: Payer: Self-pay | Admitting: Family Medicine

## 2019-09-29 VITALS — BP 112/82 | HR 75 | Temp 98.2°F | Ht 66.5 in | Wt 161.5 lb

## 2019-09-29 DIAGNOSIS — M76891 Other specified enthesopathies of right lower limb, excluding foot: Secondary | ICD-10-CM | POA: Diagnosis not present

## 2019-09-29 DIAGNOSIS — M25561 Pain in right knee: Secondary | ICD-10-CM | POA: Diagnosis not present

## 2019-09-29 MED ORDER — METHYLPREDNISOLONE ACETATE 40 MG/ML IJ SUSP
80.0000 mg | Freq: Once | INTRAMUSCULAR | Status: AC
  Administered 2019-09-29: 80 mg via INTRA_ARTICULAR

## 2019-09-29 MED ORDER — DICLOFENAC SODIUM 75 MG PO TBEC: 75.0000 mg | DELAYED_RELEASE_TABLET | Freq: Two times a day (BID) | ORAL | 3 refills | Status: DC

## 2019-09-30 ENCOUNTER — Encounter: Payer: Self-pay | Admitting: Family Medicine

## 2019-10-28 ENCOUNTER — Encounter: Payer: Self-pay | Admitting: Family Medicine

## 2019-10-29 MED ORDER — PREDNISONE 20 MG PO TABS: ORAL_TABLET | ORAL | 0 refills | Status: DC

## 2019-11-14 HISTORY — PX: BREAST IMPLANT REMOVAL: SUR1101

## 2019-11-30 ENCOUNTER — Encounter: Payer: Self-pay | Admitting: Family Medicine

## 2019-12-01 NOTE — Telephone Encounter (Signed)
Can you help her schedule an appointment.  Thanks, Lyla Son.  I think that Zella Ball is out of town.

## 2019-12-02 NOTE — Telephone Encounter (Signed)
Left message asking pt to call office  °

## 2019-12-02 NOTE — Telephone Encounter (Signed)
Appointment 120

## 2019-12-03 ENCOUNTER — Ambulatory Visit (INDEPENDENT_AMBULATORY_CARE_PROVIDER_SITE_OTHER): Payer: 59 | Admitting: Family Medicine

## 2019-12-03 ENCOUNTER — Other Ambulatory Visit: Payer: Self-pay

## 2019-12-03 ENCOUNTER — Encounter: Payer: Self-pay | Admitting: Family Medicine

## 2019-12-03 VITALS — BP 120/70 | HR 72 | Temp 98.7°F | Ht 66.5 in | Wt 166.2 lb

## 2019-12-03 DIAGNOSIS — R5383 Other fatigue: Secondary | ICD-10-CM

## 2019-12-03 DIAGNOSIS — R635 Abnormal weight gain: Secondary | ICD-10-CM

## 2019-12-03 DIAGNOSIS — N96 Recurrent pregnancy loss: Secondary | ICD-10-CM | POA: Diagnosis not present

## 2019-12-03 DIAGNOSIS — Z92241 Personal history of systemic steroid therapy: Secondary | ICD-10-CM | POA: Diagnosis not present

## 2019-12-03 LAB — CBC WITH DIFFERENTIAL/PLATELET
Basophils Absolute: 0.1 10*3/uL (ref 0.0–0.1)
Basophils Relative: 0.8 % (ref 0.0–3.0)
Eosinophils Absolute: 0.3 10*3/uL (ref 0.0–0.7)
Eosinophils Relative: 4.2 % (ref 0.0–5.0)
HCT: 41.7 % (ref 36.0–46.0)
Hemoglobin: 13.9 g/dL (ref 12.0–15.0)
Lymphocytes Relative: 24.8 % (ref 12.0–46.0)
Lymphs Abs: 1.7 10*3/uL (ref 0.7–4.0)
MCHC: 33.3 g/dL (ref 30.0–36.0)
MCV: 89.7 fl (ref 78.0–100.0)
Monocytes Absolute: 0.3 10*3/uL (ref 0.1–1.0)
Monocytes Relative: 5 % (ref 3.0–12.0)
Neutro Abs: 4.4 10*3/uL (ref 1.4–7.7)
Neutrophils Relative %: 65.2 % (ref 43.0–77.0)
Platelets: 243 10*3/uL (ref 150.0–400.0)
RBC: 4.65 Mil/uL (ref 3.87–5.11)
RDW: 12.2 % (ref 11.5–15.5)
WBC: 6.7 10*3/uL (ref 4.0–10.5)

## 2019-12-03 LAB — BASIC METABOLIC PANEL
BUN: 14 mg/dL (ref 6–23)
CO2: 27 mEq/L (ref 19–32)
Calcium: 9.9 mg/dL (ref 8.4–10.5)
Chloride: 103 mEq/L (ref 96–112)
Creatinine, Ser: 0.83 mg/dL (ref 0.40–1.20)
GFR: 79.43 mL/min (ref >60.00)
Glucose, Bld: 77 mg/dL (ref 70–99)
Potassium: 3.6 mEq/L (ref 3.5–5.1)
Sodium: 138 mEq/L (ref 135–145)

## 2019-12-03 LAB — HEPATIC FUNCTION PANEL
ALT: 20 U/L (ref 0–35)
AST: 19 U/L (ref 0–37)
Albumin: 4.7 g/dL (ref 3.5–5.2)
Alkaline Phosphatase: 47 U/L (ref 39–117)
Bilirubin, Direct: 0.1 mg/dL (ref 0.0–0.3)
Total Bilirubin: 0.5 mg/dL (ref 0.2–1.2)
Total Protein: 7.3 g/dL (ref 6.0–8.3)

## 2019-12-03 LAB — T3, FREE: T3, Free: 3.1 pg/mL (ref 2.3–4.2)

## 2019-12-03 LAB — TSH: TSH: 3.13 u[IU]/mL (ref 0.35–4.50)

## 2019-12-03 LAB — TESTOSTERONE: Testosterone: 25.89 ng/dL (ref 15.00–40.00)

## 2019-12-03 LAB — HEMOGLOBIN A1C: Hgb A1c MFr Bld: 5.5 % (ref 4.6–6.5)

## 2019-12-03 LAB — T4, FREE: Free T4: 0.91 ng/dL (ref 0.60–1.60)

## 2019-12-03 NOTE — Progress Notes (Signed)
Judith Martinez T. Judith Grandberry, MD Primary Care and [place] at [facility] [place], [address] Phone: [phone]  FAX: [phone]  Judith Martinez - 34 y.o. female  MRN [id number]  Date of Birth: January 17, 1986  Visit Date: 12/03/2019  PCP: Owens Loffler, MD  Referred by: Owens Loffler, MD  Chief Complaint  Patient presents with  . Fatigue  . Recurrent Miscarriage    x 2    This visit occurred during the SARS-CoV-2 public health emergency.  Safety protocols were in place, including screening questions prior to the visit, additional usage of staff PPE, and extensive cleaning of exam room while observing appropriate contact time as indicated for disinfecting solutions.   Subjective:   Judith Martinez is a 34 y.o. very pleasant female patient who presents with the following:  This is a complicated infertility question in a patient with a fairly large volume of androgenic steroid use in the past with female sex.  She has not had any performance-enhancing drugs in at least 1 year.  She previously recently has had an HSG done by a fertility doctor.  She has not had any blood work.  She has unfortunately had 2 miscarriages in the last year.  Given her unique situation, the patient inquires about hormonal disruption due to prior performance enhancing drugs.  She took these for multiple years without taking a break.  She also has a history of a BTL reversal about 5 years ago.  She has had 5 pregnancies to completion.  Had an HSG in September.  Tubes are open.  ? If Judith Martinez.   Progestone after December.   No blood work.  5 years ago had a tubal reversal.   Tired a lot.  Constantly cold.  Hard to work out.   Nothing in a year.  2015 - 1 1/2 on a half.   Past Medical History, Surgical History, Social History, Family History, Problem List, Medications, and Allergies have been reviewed and updated if relevant.   GEN: No acute  illnesses, no fevers, chills.  She does feel quite fatigued. GI: No n/v/d, eating normally Pulm: No SOB Interactive and getting along well at home.  Otherwise, ROS is as per the HPI.  Objective:   BP 120/70   Pulse 72   Temp 98.7 F (37.1 C) (Temporal)   Ht 5' 6.5" (1.689 m)   Wt 166 lb 4 oz (75.4 kg)   LMP 11/07/2019   SpO2 98%   BMI 26.43 kg/m   GEN: WDWN, NAD, Non-toxic, A & O x 3 HEENT: Atraumatic, Normocephalic. Neck supple. No masses, No LAD. Ears and Nose: No external deformity. EXTR: No c/c/e NEURO Normal gait.  PSYCH: Normally interactive. Conversant. Not depressed or anxious appearing.  Calm demeanor.   Laboratory and Imaging Data:  Assessment and Plan:     ICD-10-CM   1. History of recurrent miscarriages  N96 Recurrent Miscarriage Eval/Coag Pnl    T3, free    T4, free    TSH    FSH/LH    Anti mullerian hormone    Inhibin B    Hemoglobin D6Q    Basic metabolic panel    CBC with Differential/Platelet    Hepatic function panel    Progesterone    Prolactin  2. History of anabolic steroid use  I29.798 Recurrent Miscarriage Eval/Coag Pnl    T3, free    T4, free    TSH    FSH/LH  Anti mullerian hormone    Inhibin B    Hemoglobin A1c    Basic metabolic panel    CBC with Differential/Platelet    Hepatic function panel    Progesterone    Prolactin    Estradiol    Testosterone  3. Fatigue, unspecified type  R53.83 T3, free    T4, free    TSH    FSH/LH    Anti mullerian hormone    Progesterone    Prolactin    Estradiol    Testosterone  4. Weight gain  R63.5 T3, free    T4, free    TSH    FSH/LH    Progesterone    Prolactin    Estradiol    Testosterone   Total encounter time: 30-39 minutes. On the day of the patient encounter, this can include review of prior records, labs, and imaging.  Additional time can include counselling, consultation with peer MD in person or by telephone.  This also includes independent review of Radiology.  I  reviewed literature in up-to-date, and I am going to do a classic recurrent miscarriage work-up, and also do a post anabolic steroid work-up as well.  Hopefully this will be of some benefit.  If there are some significant problems, then she should go back to her infertility specialist.  She understands this.  I tried to counsel her the best I could about her miscarriages.  Follow-up: No follow-ups on file.  No orders of the defined types were placed in this encounter.  Orders Placed This Encounter  Procedures  . Recurrent Miscarriage Eval/Coag Pnl  . T3, free  . T4, free  . TSH  . FSH/LH  . Anti mullerian hormone  . Inhibin B  . Hemoglobin A1c  . Basic metabolic panel  . CBC with Differential/Platelet  . Hepatic function panel  . Progesterone  . Prolactin  . Estradiol  . Testosterone    Signed,  Karleen Hampshire T. Tanicka Bisaillon, MD   Outpatient Encounter Medications as of 12/03/2019  Medication Sig  . [DISCONTINUED] diclofenac (VOLTAREN) 75 MG EC tablet Take 1 tablet (75 mg total) by mouth 2 (two) times daily.  . [DISCONTINUED] predniSONE (DELTASONE) 20 MG tablet 2 tabs po daily for 5 days, then 1 tab po daily for 5 days   No facility-administered encounter medications on file as of 12/03/2019.

## 2019-12-09 LAB — FSH/LH
FSH: 5.2 m[IU]/mL
LH: 4.4 m[IU]/mL

## 2019-12-09 LAB — PROGESTERONE: Progesterone: 1 ng/mL

## 2019-12-09 LAB — INHIBIN B: Inhibin B: 25 pg/mL

## 2019-12-09 LAB — ANTI-MULLERIAN HORMONE (AMH), FEMALE: Anti-Mullerian Hormones(AMH), Female: 4.12 ng/mL (ref 0.36–10.07)

## 2019-12-09 LAB — ESTRADIOL: Estradiol: 56 pg/mL

## 2019-12-09 LAB — PROLACTIN: Prolactin: 7.2 ng/mL

## 2019-12-10 ENCOUNTER — Encounter: Payer: Self-pay | Admitting: Family Medicine

## 2019-12-17 LAB — RECURRENT MISCARRIAGE EVAL/COAG PNL
AntiThromb III Func: 122 % normal (ref 80–135)
Anticardiolipin IgA: <11 [APL'U] (ref <=11)
Anticardiolipin IgG: <14 [GPL'U] (ref <=14)
Anticardiolipin IgM: 14 [MPL'U] — ABNORMAL HIGH (ref <=12)
Beta-2 Glyco 1 IgA: <9 SAU (ref <=20)
Beta-2 Glyco 1 IgM: <9 SMU (ref <=20)
Beta-2 Glyco I IgG: <9 SGU (ref <=20)
Homocysteine: 4.2 umol/L (ref <10.4)
PHOSPHATIDYLSERINE AB  (IGG): <10 U/mL (ref <10)
PHOSPHATIDYLSERINE AB  (IGM): <25 U/mL (ref <25)
PTT-LA Screen: 37 s (ref <=40)
Protein C Activity: 102 % (ref 70–180)
Protein S Ag, Free: 94 % normal (ref 50–147)
dRVVT: 32 s (ref <=45)

## 2020-02-13 ENCOUNTER — Encounter: Payer: Self-pay | Admitting: Family Medicine

## 2020-03-11 ENCOUNTER — Encounter: Payer: Self-pay | Admitting: Family Medicine

## 2020-03-18 DIAGNOSIS — Z20828 Contact with and (suspected) exposure to other viral communicable diseases: Secondary | ICD-10-CM | POA: Diagnosis not present

## 2020-04-20 ENCOUNTER — Encounter: Payer: Self-pay | Admitting: Family Medicine

## 2020-04-21 MED ORDER — VALACYCLOVIR HCL 1 G PO TABS: ORAL_TABLET | ORAL | 3 refills | Status: DC

## 2020-04-28 ENCOUNTER — Other Ambulatory Visit: Payer: Self-pay | Admitting: Family Medicine

## 2020-05-29 ENCOUNTER — Encounter: Payer: Self-pay | Admitting: Family Medicine

## 2020-05-31 NOTE — Telephone Encounter (Signed)
Form printed and placed in Dr. Copland's office in box to complete. °

## 2020-06-18 ENCOUNTER — Encounter: Payer: Self-pay | Admitting: Family Medicine

## 2020-06-18 DIAGNOSIS — R768 Other specified abnormal immunological findings in serum: Secondary | ICD-10-CM

## 2020-06-18 DIAGNOSIS — Z92241 Personal history of systemic steroid therapy: Secondary | ICD-10-CM

## 2020-06-18 DIAGNOSIS — R7989 Other specified abnormal findings of blood chemistry: Secondary | ICD-10-CM

## 2020-07-09 ENCOUNTER — Encounter: Payer: Self-pay | Admitting: Family Medicine

## 2020-07-09 DIAGNOSIS — K5900 Constipation, unspecified: Secondary | ICD-10-CM

## 2020-07-09 DIAGNOSIS — K921 Melena: Secondary | ICD-10-CM

## 2020-07-12 ENCOUNTER — Encounter: Payer: Self-pay | Admitting: Gastroenterology

## 2020-07-22 ENCOUNTER — Other Ambulatory Visit: Payer: Self-pay | Admitting: Family Medicine

## 2020-07-22 DIAGNOSIS — Z9889 Other specified postprocedural states: Secondary | ICD-10-CM | POA: Diagnosis not present

## 2020-07-22 DIAGNOSIS — Z124 Encounter for screening for malignant neoplasm of cervix: Secondary | ICD-10-CM | POA: Diagnosis not present

## 2020-07-22 DIAGNOSIS — Z3183 Encounter for assisted reproductive fertility procedure cycle: Secondary | ICD-10-CM | POA: Diagnosis not present

## 2020-07-22 LAB — HM PAP SMEAR: HM Pap smear: NEGATIVE

## 2020-07-22 NOTE — Telephone Encounter (Signed)
Last office visit 12/03/2019 for fatigue/recurrent miscarriage.  Last refilled 04/21/2020 for #30 with 3 refills.  No future appointments.

## 2020-07-26 ENCOUNTER — Other Ambulatory Visit: Payer: Self-pay

## 2020-07-26 ENCOUNTER — Encounter: Payer: Self-pay | Admitting: Endocrinology

## 2020-07-26 ENCOUNTER — Ambulatory Visit (INDEPENDENT_AMBULATORY_CARE_PROVIDER_SITE_OTHER): Payer: 59 | Admitting: Endocrinology

## 2020-07-26 DIAGNOSIS — R635 Abnormal weight gain: Secondary | ICD-10-CM

## 2020-07-26 DIAGNOSIS — E039 Hypothyroidism, unspecified: Secondary | ICD-10-CM | POA: Insufficient documentation

## 2020-07-26 DIAGNOSIS — E063 Autoimmune thyroiditis: Secondary | ICD-10-CM

## 2020-07-26 DIAGNOSIS — E038 Other specified hypothyroidism: Secondary | ICD-10-CM
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
## 2020-07-26 NOTE — Patient Instructions (Addendum)
Blood tests are requested for you today.  We'll let you know about the results.         Hypothyroidism  Hypothyroidism is when the thyroid gland does not make enough of certain hormones (it is underactive). The thyroid gland is a small gland located in the lower front part of the neck, just in front of the windpipe (trachea). This gland makes hormones that help control how the body uses food for energy (metabolism) as well as how the heart and brain function. These hormones also play a role in keeping your bones strong. When the thyroid is underactive, it produces too little of the hormones thyroxine (T4) and triiodothyronine (T3). What are the causes? This condition may be caused by:  Hashimoto's disease. This is a disease in which the body's disease-fighting system (immune system) attacks the thyroid gland. This is the most common cause.  Viral infections.  Pregnancy.  Certain medicines.  Birth defects.  Past radiation treatments to the head or neck for cancer.  Past treatment with radioactive iodine.  Past exposure to radiation in the environment.  Past surgical removal of part or all of the thyroid.  Problems with a gland in the center of the brain (pituitary gland).  Lack of enough iodine in the diet. What increases the risk? You are more likely to develop this condition if:  You are female.  You have a family history of thyroid conditions.  You use a medicine called lithium.  You take medicines that affect the immune system (immunosuppressants). What are the signs or symptoms? Symptoms of this condition include:  Feeling as though you have no energy (lethargy).  Not being able to tolerate cold.  Weight gain that is not explained by a change in diet or exercise habits.  Lack of appetite.  Dry skin.  Coarse hair.  Menstrual irregularity.  Slowing of thought processes.  Constipation.  Sadness or depression. How is this diagnosed? This condition  may be diagnosed based on:  Your symptoms, your medical history, and a physical exam.  Blood tests. You may also have imaging tests, such as an ultrasound or MRI. How is this treated? This condition is treated with medicine that replaces the thyroid hormones that your body does not make. After you begin treatment, it may take several weeks for symptoms to go away. Follow these instructions at home:  Take over-the-counter and prescription medicines only as told by your health care provider.  If you start taking any new medicines, tell your health care provider.  Keep all follow-up visits as told by your health care provider. This is important. ? As your condition improves, your dosage of thyroid hormone medicine may change. ? You will need to have blood tests regularly so that your health care provider can monitor your condition. Contact a health care provider if:  Your symptoms do not get better with treatment.  You are taking thyroid replacement medicine and you: ? Sweat a lot. ? Have tremors. ? Feel anxious. ? Lose weight rapidly. ? Cannot tolerate heat. ? Have emotional swings. ? Have diarrhea. ? Feel weak. Get help right away if you have:  Chest pain.  An irregular heartbeat.  A rapid heartbeat.  Difficulty breathing. Summary  Hypothyroidism is when the thyroid gland does not make enough of certain hormones (it is underactive).  When the thyroid is underactive, it produces too little of the hormones thyroxine (T4) and triiodothyronine (T3).  The most common cause is Hashimoto's disease, a disease in which  the body's disease-fighting system (immune system) attacks the thyroid gland. The condition can also be caused by viral infections, medicine, pregnancy, or past radiation treatment to the head or neck.  Symptoms may include weight gain, dry skin, constipation, feeling as though you do not have energy, and not being able to tolerate cold.  This condition is  treated with medicine to replace the thyroid hormones that your body does not make. This information is not intended to replace advice given to you by your health care provider. Make sure you discuss any questions you have with your health care provider. Document Revised: 10/12/2017 Document Reviewed: 10/10/2017 Elsevier Patient Education  2020 [employer].

## 2020-07-27 ENCOUNTER — Encounter: Payer: Self-pay | Admitting: Endocrinology

## 2020-07-27 ENCOUNTER — Other Ambulatory Visit: Payer: Self-pay

## 2020-07-27 DIAGNOSIS — E063 Autoimmune thyroiditis: Secondary | ICD-10-CM | POA: Diagnosis not present

## 2020-07-27 DIAGNOSIS — E038 Other specified hypothyroidism: Secondary | ICD-10-CM

## 2020-07-28 ENCOUNTER — Encounter: Payer: Self-pay | Admitting: Endocrinology

## 2020-07-28 ENCOUNTER — Institutional Professional Consult (permissible substitution): Payer: 59 | Admitting: Plastic Surgery

## 2020-07-28 ENCOUNTER — Other Ambulatory Visit: Payer: Self-pay | Admitting: Endocrinology

## 2020-07-28 DIAGNOSIS — R635 Abnormal weight gain: Secondary | ICD-10-CM | POA: Insufficient documentation

## 2020-07-28 LAB — TSH: TSH: 2.27 u[IU]/mL (ref 0.450–4.500)

## 2020-07-28 LAB — T3, FREE: T3, Free: 3 pg/mL (ref 2.0–4.4)

## 2020-07-28 LAB — T4, FREE: Free T4: 1.5 ng/dL (ref 0.82–1.77)

## 2020-07-29 ENCOUNTER — Ambulatory Visit (INDEPENDENT_AMBULATORY_CARE_PROVIDER_SITE_OTHER): Payer: Self-pay | Admitting: Plastic Surgery

## 2020-07-29 ENCOUNTER — Other Ambulatory Visit: Payer: Self-pay

## 2020-07-29 DIAGNOSIS — Z411 Encounter for cosmetic surgery: Secondary | ICD-10-CM
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
## 2020-07-30 ENCOUNTER — Encounter: Payer: Self-pay | Admitting: Plastic Surgery

## 2020-07-31 ENCOUNTER — Other Ambulatory Visit: Payer: Self-pay | Admitting: Family Medicine

## 2020-08-02 ENCOUNTER — Encounter: Payer: Self-pay | Admitting: Family Medicine

## 2020-08-02 ENCOUNTER — Telehealth: Payer: Self-pay | Admitting: Plastic Surgery

## 2020-08-02 NOTE — Telephone Encounter (Signed)
[name] called the office to cancel everything for surgery. She indicated that she is just not comfortable proceeding at this time, possibly from apprehension from past experiences and just not having enough information to feel comfortable with the surgery. I advised that I would cancel the surgery and make Dr. Arita Miss aware.

## 2020-08-02 NOTE — Telephone Encounter (Signed)
Surgical Clearance Forms printed and placed in Dr. Cyndie Chime in box to complete.

## 2020-08-02 NOTE — Telephone Encounter (Signed)
Last office visit 12/03/2019 for fatigue and recurrent miscarriage.  Not on current medication list.  No future appointments with PCP.

## 2020-08-03 ENCOUNTER — Encounter: Payer: 59 | Admitting: Surgical

## 2020-08-03 ENCOUNTER — Encounter: Payer: Self-pay | Admitting: Endocrinology

## 2020-08-03 ENCOUNTER — Ambulatory Visit
Admission: RE | Admit: 2020-08-03 | Discharge: 2020-08-03 | Disposition: A | Discharge status: Home / Self Care | Payer: 59 | Source: Ambulatory Visit | Attending: Endocrinology | Admitting: Endocrinology

## 2020-08-03 DIAGNOSIS — E038 Other specified hypothyroidism: Secondary | ICD-10-CM

## 2020-08-03 DIAGNOSIS — E039 Hypothyroidism, unspecified: Secondary | ICD-10-CM | POA: Diagnosis not present

## 2020-08-03 NOTE — Telephone Encounter (Signed)
Pt called checking on form  Surgery is 08/13/20 Pre op is 9/24 and needs form  Please advise when ready.  Pt is aware it is in dr coplands in box and that he is not in office today

## 2020-08-03 NOTE — Telephone Encounter (Signed)
[facility] Night - Client Nonclinical Telephone Record AccessNurse Client [facility] Night - Client Client Site [facility] - Night Physician Hannah Beat - MD Contact Type Call Who Is Calling Patient / Member / Family / Caregiver Caller Name Yeimi Debnam Caller Phone Number [phone] Patient Name Judith Martinez Patient DOB 1986/08/22 Call Type Message Only Information Provided Reason for Call Request for General Office Information Initial Comment Caller states she sent over paperwork to the office to approve a surgery for the pt. Additional Comment Please contact Daylene Katayama about pre-op paperwork sent to the office. Disp. Time Disposition Final User 08/02/2020 5:18:20 PM General Information Provided Yes King-Hussey, Berdi Call Closed By: Vivianne Master Transaction Date/Time: 08/02/2020 5:13:45 PM (ET)

## 2020-08-04 NOTE — Telephone Encounter (Signed)
Dr. Patsy Lager will see Yvonne tomorrow 08/05/2020 at 12:15 pm.  Angelica Chessman notified of this via Teams.

## 2020-08-04 NOTE — Telephone Encounter (Signed)
Pt states she has been trying all week to get this taken care of, please advise.  Thank you!

## 2020-08-04 NOTE — Telephone Encounter (Signed)
She is going to need to come in.  They want some preoperative blood work done.  They also would like a physical exam recently before her surgery.

## 2020-08-04 NOTE — Telephone Encounter (Signed)
I got it 2 days ago, and I asked Judith Martinez to set her up with an appointment.    Can you help make sure this is done/  I cannot do the pre-op bloodwork over an email

## 2020-08-04 NOTE — Telephone Encounter (Signed)
Pt ask if she could see another provider.  I told her she would need to see pcp for surgical clearance.  She asked to talk to office manager.  Mandy  Will call pt back

## 2020-08-05 ENCOUNTER — Encounter: Payer: Self-pay | Admitting: Family Medicine

## 2020-08-05 ENCOUNTER — Other Ambulatory Visit: Payer: Self-pay

## 2020-08-05 ENCOUNTER — Ambulatory Visit (INDEPENDENT_AMBULATORY_CARE_PROVIDER_SITE_OTHER): Payer: 59 | Admitting: Family Medicine

## 2020-08-05 VITALS — BP 120/78 | HR 80 | Temp 98.5°F | Ht 67.0 in | Wt 165.0 lb

## 2020-08-05 DIAGNOSIS — Z9882 Breast implant status: Secondary | ICD-10-CM

## 2020-08-05 NOTE — Progress Notes (Signed)
    Judith Rago T. Lexus Shampine, MD, CAQ Sports Medicine  Primary Care and Sports Medicine [facility] [address] [place], [address]  Phone: [phone]  FAX: [phone]  Judith Martinez - 34 y.o. female  MRN [id number]  Date of Birth: Dec 02, 1985  Date: 08/05/2020  PCP: Hannah Beat, MD  Referral: Hannah Beat, MD  Chief Complaint  Patient presents with  . Surgical Clearance    Having breast implant removal and lift. Dr Patsy Lager has form. No EKG is needed.    This visit occurred during the SARS-CoV-2 public health emergency.  Safety protocols were in place, including screening questions prior to the visit, additional usage of staff PPE, and extensive cleaning of exam room while observing appropriate contact time as indicated for disinfecting solutions.   Subjective:   Judith Martinez is a 34 y.o. very pleasant female patient who presents with the following:  Preoperative evaluation for breast implant removal and lift: She is seeing Dr. Freida Busman in [place].  2012 - last July silicone, then had a second surgery. Has done pretty poorly. She had 2 surgeries in 2020, and she is not been doing well for some time with some increased fatigue and ability to work out, general lethargy and mood irritability.   Review of Systems is noted in the HPI, as appropriate  Objective:   BP 120/78 (BP Location: Left Arm, Patient Position: Sitting, Cuff Size: Large)   Pulse 80   Temp 98.5 F (36.9 C)   Ht 5\' 7"  (1.702 m)   Wt 165 lb (74.8 kg)   SpO2 98%   BMI 25.84 kg/m   GEN: WDWN, NAD, Non-toxic HEENT: Atraumatic, Normocephalic. Neck supple. No masses. CV: RRR, No M/G/R. No JVD. No thrill. No extra heart sounds. PULM: CTA B, no wheezes, crackles, rhonchi. No retractions. No resp. distress. No accessory muscle use. EXTR: No c/c/e NEURO Normal gait.  PSYCH: Normally interactive. Conversant.   Laboratory and Imaging Data: She has obtained a CBC and CMP  on her own at an independent lab and sent these to her plastic surgeon.  Assessment and Plan:     ICD-10-CM   1. Breast implant status  Z98.82    Forms completed for prior authorization and preop for breast implant removal and lift.  Potential benefits outweigh potential risks in this case.  Follow-up: No follow-ups on file.  No orders of the defined types were placed in this encounter.  Medications Discontinued During This Encounter  Medication Reason  . levothyroxine (SYNTHROID) 25 MCG tablet Change in therapy  . levothyroxine (SYNTHROID) 50 MCG tablet Change in therapy   No orders of the defined types were placed in this encounter.   Signed,  . Kamrie Fanton, MD   Outpatient Encounter Medications as of 08/05/2020  Medication Sig  . levothyroxine (SYNTHROID) 75 MCG tablet Take 75 mcg by mouth daily before breakfast.  . valACYclovir (VALTREX) 1000 MG tablet TAKE 2 TABLETS BY MOUTH THEN REPEAT 12 HOURS LATER AS NEEDED FORCOLD SORES  . [DISCONTINUED] levothyroxine (SYNTHROID) 25 MCG tablet Take by mouth.  . [DISCONTINUED] levothyroxine (SYNTHROID) 50 MCG tablet Take 50 mcg by mouth daily.   No facility-administered encounter medications on file as of 08/05/2020.

## 2020-08-06 ENCOUNTER — Encounter: Payer: Self-pay | Admitting: Family Medicine

## 2020-08-08 ENCOUNTER — Encounter: Payer: Self-pay | Admitting: Endocrinology

## 2020-08-09 ENCOUNTER — Other Ambulatory Visit: Payer: Self-pay | Admitting: Endocrinology

## 2020-08-09 MED ORDER — SYNTHROID 75 MCG PO TABS: 75.0000 ug | ORAL_TABLET | Freq: Every day | ORAL | 3 refills | Status: DC

## 2020-08-16 ENCOUNTER — Other Ambulatory Visit: Payer: Self-pay | Admitting: Family Medicine

## 2020-08-18 ENCOUNTER — Ambulatory Visit: Payer: 59 | Admitting: Gastroenterology

## 2020-08-18 ENCOUNTER — Encounter: Payer: Self-pay | Admitting: Endocrinology

## 2020-08-19 ENCOUNTER — Encounter: Payer: 59 | Admitting: Plastic Surgery

## 2020-08-19 ENCOUNTER — Other Ambulatory Visit: Payer: Self-pay | Admitting: Endocrinology

## 2020-08-20 LAB — T3, FREE: T3, Free: 2.9 pg/mL (ref 2.0–4.4)

## 2020-08-20 LAB — TSH: TSH: 0.743 u[IU]/mL (ref 0.450–4.500)

## 2020-08-20 LAB — THYROGLOBULIN ANTIBODY: Thyroglobulin Antibody: 6.7 IU/mL — ABNORMAL HIGH (ref 0.0–0.9)

## 2020-08-20 LAB — THYROID PEROXIDASE ANTIBODY: Thyroperoxidase Ab SerPl-aCnc: 240 IU/mL — ABNORMAL HIGH (ref 0–34)

## 2020-08-20 LAB — T4, FREE: Free T4: 1.11 ng/dL (ref 0.82–1.77)

## 2020-08-20 LAB — CORTISOL: Cortisol: 3.6 ug/dL

## 2020-08-24 ENCOUNTER — Ambulatory Visit: Payer: 59 | Admitting: Endocrinology

## 2020-10-08 ENCOUNTER — Encounter: Payer: Self-pay | Admitting: Endocrinology

## 2020-10-14 ENCOUNTER — Encounter: Payer: Self-pay | Admitting: Family Medicine

## 2020-10-26 ENCOUNTER — Encounter: Payer: Self-pay | Admitting: Family Medicine

## 2020-10-28 ENCOUNTER — Other Ambulatory Visit: Payer: 59

## 2020-10-28 ENCOUNTER — Other Ambulatory Visit (INDEPENDENT_AMBULATORY_CARE_PROVIDER_SITE_OTHER): Payer: 59

## 2020-10-28 ENCOUNTER — Other Ambulatory Visit: Payer: Self-pay

## 2020-10-28 ENCOUNTER — Other Ambulatory Visit: Payer: Self-pay | Admitting: Family Medicine

## 2020-10-28 DIAGNOSIS — R5383 Other fatigue: Secondary | ICD-10-CM | POA: Diagnosis not present

## 2020-10-28 DIAGNOSIS — M791 Myalgia, unspecified site: Secondary | ICD-10-CM | POA: Diagnosis not present

## 2020-10-28 DIAGNOSIS — R519 Headache, unspecified: Secondary | ICD-10-CM

## 2020-10-28 DIAGNOSIS — M2550 Pain in unspecified joint: Secondary | ICD-10-CM

## 2020-10-28 LAB — SEDIMENTATION RATE: Sed Rate: 3 mm/hr (ref 0–20)

## 2020-10-28 LAB — HIGH SENSITIVITY CRP: CRP, High Sensitivity: 0.24 mg/L (ref 0.000–5.000)

## 2020-10-28 NOTE — Telephone Encounter (Signed)
Please call and have her set up an appointment next week

## 2020-10-28 NOTE — Telephone Encounter (Signed)
Appointment scheduled for Monday, 11/01/20 at 1440.

## 2020-10-28 NOTE — Progress Notes (Signed)
orders

## 2020-11-01 ENCOUNTER — Ambulatory Visit: Payer: 59 | Admitting: Family Medicine

## 2020-11-01 NOTE — Progress Notes (Deleted)
    Jeovanni Heuring T. Bryant Saye, MD, CAQ Sports Medicine  Primary Care and Sports Medicine [facility] at [facility] [address] [place], [address]  Phone: [phone]  FAX: [phone]  Judith Martinez - 34 y.o. female  MRN [id number]  Date of Birth: 1985/12/27  Date: 11/01/2020  PCP: Hannah Beat, MD  Referral: Hannah Beat, MD  No chief complaint on file.   This visit occurred during the SARS-CoV-2 public health emergency.  Safety protocols were in place, including screening questions prior to the visit, additional usage of staff PPE, and extensive cleaning of exam room while observing appropriate contact time as indicated for disinfecting solutions.   Subjective:   Judith Martinez is a 34 y.o. very pleasant female patient with There is no height or weight on file to calculate BMI. who presents with the following:  Judith Martinez is here for evaluation of her knee pain.    Review of Systems is noted in the HPI, as appropriate  Objective:   There were no vitals taken for this visit.  GEN: No acute distress; alert,appropriate. PULM: Breathing comfortably in no respiratory distress PSYCH: Normally interactive.   Laboratory and Imaging Data:  Assessment and Plan:   ***

## 2020-11-03 ENCOUNTER — Encounter: Payer: Self-pay | Admitting: Family Medicine

## 2020-11-03 DIAGNOSIS — R5383 Other fatigue: Secondary | ICD-10-CM

## 2020-11-03 DIAGNOSIS — M791 Myalgia, unspecified site: Secondary | ICD-10-CM

## 2020-11-03 DIAGNOSIS — M2550 Pain in unspecified joint: Secondary | ICD-10-CM

## 2020-11-03 DIAGNOSIS — R768 Other specified abnormal immunological findings in serum: Secondary | ICD-10-CM

## 2020-11-04 LAB — ANTI-NUCLEAR AB-TITER (ANA TITER): ANA Titer 1: 1:40 {titer} — ABNORMAL HIGH

## 2020-11-04 LAB — ANA SCREEN,IFA,REFLEX TITER/PATTERN,REFLEX MPLX 11 AB CASCADE
14-3-3 eta Protein: <0.2 ng/mL (ref <0.2)
Anti Nuclear Antibody (ANA): POSITIVE — AB
Cyclic Citrullin Peptide Ab: <16 UNITS
Rheumatoid fact SerPl-aCnc: <14 IU/mL (ref <14)

## 2020-11-04 LAB — TIER 2
Jo-1 Autoabs: <1 AI
SSA (Ro) (ENA) Antibody, IgG: <1 AI
SSB (La) (ENA) Antibody, IgG: <1 AI
Scleroderma (Scl-70) (ENA) Antibody, IgG: <1 AI

## 2020-11-04 LAB — TIER 1
Chromatin (Nucleosomal) Antibody: <1 AI
ENA SM Ab Ser-aCnc: <1 AI
Ribonucleic Protein(ENA) Antibody, IgG: <1 AI
SM/RNP: <1 AI
ds DNA Ab: <1 IU/mL

## 2020-11-04 LAB — INTERPRETATION

## 2020-11-04 LAB — TIER 3
Centromere Ab Screen: <1 AI
Ribosomal P Protein Ab: <1 AI

## 2020-11-18 ENCOUNTER — Encounter: Payer: Self-pay | Admitting: Family Medicine

## 2020-11-22 MED ORDER — CLOTRIMAZOLE 1 % EX CREA: 1.0000 "application " | TOPICAL_CREAM | Freq: Two times a day (BID) | CUTANEOUS | 1 refills | Status: DC

## 2020-12-06 ENCOUNTER — Encounter: Payer: Self-pay | Admitting: Family Medicine

## 2020-12-06 NOTE — Telephone Encounter (Signed)
Judith Martinez,   Can we schedule her an appointment.  Thursday? Can be later if she wants.

## 2020-12-09 ENCOUNTER — Ambulatory Visit: Payer: 59 | Admitting: Family Medicine

## 2020-12-13 ENCOUNTER — Encounter: Payer: Self-pay | Admitting: *Deleted

## 2020-12-13 ENCOUNTER — Encounter: Payer: Self-pay | Admitting: Nurse Practitioner

## 2020-12-13 DIAGNOSIS — M791 Myalgia, unspecified site: Secondary | ICD-10-CM

## 2020-12-13 DIAGNOSIS — M2550 Pain in unspecified joint: Secondary | ICD-10-CM

## 2020-12-13 DIAGNOSIS — R5383 Other fatigue: Secondary | ICD-10-CM

## 2020-12-13 DIAGNOSIS — R768 Other specified abnormal immunological findings in serum: Secondary | ICD-10-CM

## 2020-12-28 ENCOUNTER — Encounter: Payer: Self-pay | Admitting: Endocrinology

## 2020-12-28 ENCOUNTER — Other Ambulatory Visit (INDEPENDENT_AMBULATORY_CARE_PROVIDER_SITE_OTHER): Payer: 59

## 2020-12-28 ENCOUNTER — Encounter: Payer: Self-pay | Admitting: Nurse Practitioner

## 2020-12-28 ENCOUNTER — Ambulatory Visit (INDEPENDENT_AMBULATORY_CARE_PROVIDER_SITE_OTHER): Payer: 59 | Admitting: Nurse Practitioner

## 2020-12-28 ENCOUNTER — Other Ambulatory Visit: Payer: Self-pay

## 2020-12-28 VITALS — BP 120/82 | HR 88 | Ht 67.0 in | Wt 168.0 lb

## 2020-12-28 DIAGNOSIS — R112 Nausea with vomiting, unspecified: Secondary | ICD-10-CM | POA: Diagnosis not present

## 2020-12-28 DIAGNOSIS — K625 Hemorrhage of anus and rectum: Secondary | ICD-10-CM | POA: Diagnosis not present

## 2020-12-28 DIAGNOSIS — R1030 Lower abdominal pain, unspecified: Secondary | ICD-10-CM | POA: Diagnosis not present

## 2020-12-28 DIAGNOSIS — K649 Unspecified hemorrhoids: Secondary | ICD-10-CM

## 2020-12-28 DIAGNOSIS — R1011 Right upper quadrant pain: Secondary | ICD-10-CM | POA: Diagnosis not present

## 2020-12-28 LAB — CBC WITH DIFFERENTIAL/PLATELET
Basophils Absolute: 0.1 10*3/uL (ref 0.0–0.1)
Basophils Relative: 0.7 % (ref 0.0–3.0)
Eosinophils Absolute: 0.3 10*3/uL (ref 0.0–0.7)
Eosinophils Relative: 4.8 % (ref 0.0–5.0)
HCT: 40.5 % (ref 36.0–46.0)
Hemoglobin: 13.6 g/dL (ref 12.0–15.0)
Lymphocytes Relative: 24.9 % (ref 12.0–46.0)
Lymphs Abs: 1.8 10*3/uL (ref 0.7–4.0)
MCHC: 33.6 g/dL (ref 30.0–36.0)
MCV: 87.5 fl (ref 78.0–100.0)
Monocytes Absolute: 0.5 10*3/uL (ref 0.1–1.0)
Monocytes Relative: 6.4 % (ref 3.0–12.0)
Neutro Abs: 4.6 10*3/uL (ref 1.4–7.7)
Neutrophils Relative %: 63.2 % (ref 43.0–77.0)
Platelets: 281 10*3/uL (ref 150.0–400.0)
RBC: 4.63 Mil/uL (ref 3.87–5.11)
RDW: 13.2 % (ref 11.5–15.5)
WBC: 7.2 10*3/uL (ref 4.0–10.5)

## 2020-12-28 LAB — COMPREHENSIVE METABOLIC PANEL
ALT: 15 U/L (ref 0–35)
AST: 16 U/L (ref 0–37)
Albumin: 4.4 g/dL (ref 3.5–5.2)
Alkaline Phosphatase: 40 U/L (ref 39–117)
BUN: 9 mg/dL (ref 6–23)
CO2: 27 mEq/L (ref 19–32)
Calcium: 9.8 mg/dL (ref 8.4–10.5)
Chloride: 103 mEq/L (ref 96–112)
Creatinine, Ser: 0.83 mg/dL (ref 0.40–1.20)
GFR: 92.55 mL/min (ref >60.00)
Glucose, Bld: 91 mg/dL (ref 70–99)
Potassium: 3.5 mEq/L (ref 3.5–5.1)
Sodium: 136 mEq/L (ref 135–145)
Total Bilirubin: 0.6 mg/dL (ref 0.2–1.2)
Total Protein: 7.3 g/dL (ref 6.0–8.3)

## 2020-12-28 LAB — C-REACTIVE PROTEIN: CRP: <1 mg/dL (ref 0.5–20.0)

## 2020-12-28 MED ORDER — LINACLOTIDE 145 MCG PO CAPS: 145.0000 ug | ORAL_CAPSULE | Freq: Every day | ORAL | 2 refills | Status: DC

## 2020-12-28 MED ORDER — OMEPRAZOLE 40 MG PO CPDR: 40.0000 mg | DELAYED_RELEASE_CAPSULE | Freq: Every day | ORAL | 3 refills | Status: DC

## 2020-12-28 MED ORDER — HYDROCORTISONE ACETATE 25 MG RE SUPP: 25.0000 mg | Freq: Every evening | RECTAL | 0 refills | Status: DC | PRN

## 2020-12-28 NOTE — Progress Notes (Signed)
12/28/2020 Nerine Pulse [id number] 23-Apr-1986   CHIEF COMPLAINT: Rectal bleeding, constipation   HISTORY OF PRESENT ILLNESS: Yamil Dougher is a 35 year old female with a past medical history of  pyelonephritis, migraine headaches, Hashimoto's thyroiditis. Past tonsillectomy, tubal ligation, tubal reversal and breast augmentation surgery and s/p breast implant removal.  She presents to our office today as referred by Dr. Dallas Schimke for further evaluation for abdominal pain and rectal bleeding.  She has numerous GI symptoms.  She complains of having heartburn 4 to 5 days weekly since 06/2020.  She has frequent nausea.  She vomits if she drinks too much water or other fluids.  Emesis is nonbloody.  She has early satiety, feels full after eating 3 bites of food.  She complains of right upper quadrant abdominal pain which is sharp and achy at times which comes and goes for the past 3 weeks.  No specific food or stress triggers.  She is mostly concerned regarding her chronic constipation, rectal bleeding, abdominal bloat and generalized abdominal pain which started more than 2 years ago. She stated she was evaluated by a GI in [place] and she was prescribed IBgard for IBS symptoms.  She takes prune lax 8 tabs daily which results in passing a loose to watery diarrhea bowel movement daily.  If she does not take prune lax she can go 9 days without passing a bowel movement.  She constantly feels bloated.  Sees dark or red blood on the toilet tissue once or twice every 6 months.  Approximately 2 years ago, she passed a large amount of blood from the rectum which turned the toilet water red without recurrence.  She last saw red blood per the rectum described as a darker clot 2 months ago.  She avoids NSAIDs. She has chronic fatigue and generalized body aches and she is scheduled to see a rheumatologist in the near future.  Past Medical History:  Diagnosis Date  . History of kidney stones 2013  . History of  pyelonephritis 2012  . Hx of migraines    Past Surgical History:  Procedure Laterality Date  . BREAST ENHANCEMENT SURGERY  2012  . TONSILLECTOMY  1994  . TUBAL LIGATION  10/13  . tubal reversal  2016   Social History: She is married.  She has 2 sons and 3 daughters.  Gym owner/trainer.  Alcohol  intake is 0-1 alcoholic beverages daily.  No drug use.   Family History: Mother age 2 unknown health issues but her mother not see a physician on a regular basis.  Father age 65 with heart issues, HTN, obese.  Her sister had cancer in her leg she was 35 years old.  Paternal grandmother and paternal grandfather with history of colon polyps.  Paternal grandfather with Parkinson's disease.  Maternal grandfather died from a brain tumor.   No Known Allergies    Outpatient Encounter Medications as of 12/28/2020  Medication Sig  . clotrimazole (LOTRIMIN) 1 % cream Apply 1 application topically 2 (two) times daily.  Marland Kitchen SYNTHROID 75 MCG tablet Take 1 tablet (75 mcg total) by mouth daily before breakfast.  . valACYclovir (VALTREX) 1000 MG tablet TAKE 2 TABLETS BY MOUTH THEN REPEAT 12 HOURS LATER AS NEEDED FORCOLD SORES   No facility-administered encounter medications on file as of 12/28/2020.    REVIEW OF SYSTEMS:  Gen:+ Fatigue.  Denies fever, sweats or chills. No weight loss.  CV: Denies chest pain, palpitations or edema. Resp: Denies cough, shortness of breath of hemoptysis.  GI: See HPI. GU : + excessive urination. MS: Generalized body aches, back pain and muscle pains. Derm: Denies rash, itchiness, skin lesions or unhealing ulcers. Psych: Denies depression, anxiety, memory loss, suicidal ideation and confusion. Heme: Denies bruising, bleeding. Neuro:  + headaches. No dizziness or paresthesias. Endo:  + excessive thirst.    PHYSICAL EXAM: BP 120/82   Pulse 88   Ht 5\' 7"  (1.702 m)   Wt 168 lb (76.2 kg)   SpO2 97%   BMI 26.31 kg/m  General: Well developed  35 year old female in no acute  distress. Head: Normocephalic and atraumatic. Eyes:  Sclerae non-icteric, conjunctive pink. Ears: Normal auditory acuity. Mouth: Dentition intact. No ulcers or lesions.  Neck: Supple, no lymphadenopathy or thyromegaly.  Lungs: Clear bilaterally to auscultation without wheezes, crackles or rhonchi. Heart: Regular rate and rhythm. No murmur, rub or gallop appreciated.  Abdomen: Soft, nontender, non distended. No masses. No hepatosplenomegaly. Normoactive bowel sounds x 4 quadrants.  Rectal: Anterior external hemorrhoids. Internal hemorrhoids without prolapse or bleeding.  Musculoskeletal: Symmetrical with no gross deformities. Skin: Warm and dry. No rash or lesions on visible extremities. Extremities: No edema. Neurological: Alert oriented x 4, no focal deficits.  Psychological:  Alert and cooperative. Normal mood and affect.  ASSESSMENT AND PLAN:  67.  35 year old female with GERD symptoms, nausea with vomiting episodes if she drinks too much fluids and early satiety. Intermittent RUQ pain x 3 weeks.  -CMP, CBC -GERD diet discussed -Omeprazole 40 mg once daily -EGD benefits and risks discussed including risk with sedation, risk of bleeding, perforation and infection  -Discussed scheduling an abdominal ultrasound versus CTAP, await the above lab results -Patient will call our office if her symptoms worsen  2.  Chronic constipation with abdominal bloat -Linzess 145 mcg 1 tab daily -TTG, IgA and CRP  3.  Rectal bleeding.  Exam today identified internal and external hemorrhoids.  Positive family history of colon polyps.   - Anusol C 25 mg suppository 1 PR nightly as needed to use for internal hemorrhoid flares -Colonoscopy benefits and risks discussed including risk with sedation, risk of bleeding, perforation and infection  -See plan in # 2  4.  Hashimoto's thyroiditis  Further recommendations to be determined after the above evaluation completed         CC:  [place],  33, MD

## 2020-12-28 NOTE — Patient Instructions (Addendum)
If you are age 34 or younger, your body mass index should be between 19-25. Your Body mass index is 26.31 kg/m. If this is out of the aformentioned range listed, please consider follow up with your Primary Care Provider.   LABS:   Labwork has been ordered for you today.  Press "B" on the elevator. The lab is located at the first door on the left as you exit the elevator.  HEALTHCARE LAWS AND MY CHART RESULTS: Due to recent changes in healthcare laws, you may see the results of your imaging and laboratory studies on MyChart before your provider has had a chance to review them.   We understand that in some cases there may be results that are confusing or concerning to you. Not all laboratory results come back in the same time frame and the provider may be waiting for multiple results in order to interpret others.  Please give [place] 48 hours in order for your provider to thoroughly review all the results before contacting the office for clarification of your results.   PROCEDURES:  You have been scheduled for an endoscopy and colonoscopy. Please follow the written instructions given to you at your visit today. Please pick up your prep supplies at the pharmacy within the next 1-3 days. If you use inhalers (even only as needed), please bring them with you on the day of your procedure.  MEDICATION  We have sent the following medication to your pharmacy for you to pick up at your convenience:   Omeprazole 40 MG once a day Linzess take one 30 minutes before breakfast Anusol Suppository use one at bedtime.  It was great seeing you today!  Thank you for entrusting me with your care and choosing [facility].  Arnaldo Natal, CRNP

## 2020-12-29 ENCOUNTER — Telehealth: Payer: Self-pay | Admitting: Endocrinology

## 2020-12-29 LAB — IGA: Immunoglobulin A: 79 mg/dL (ref 47–310)

## 2020-12-29 LAB — TISSUE TRANSGLUTAMINASE ABS,IGG,IGA
(tTG) Ab, IgA: 116.1 U/mL — ABNORMAL HIGH
(tTG) Ab, IgG: 3.5 U/mL

## 2020-12-29 NOTE — Telephone Encounter (Signed)
Pt requested transfer of care because she feels that Dr.Ellison does not listen to her questions and she prefers a different provider that will be more thurough and someone that would check her A1c more than once a year because Dr.Ellison said he would only do it once a year. She did not like the way that he addressed her concerns. Do you still decline this patient?

## 2020-12-29 NOTE — Telephone Encounter (Signed)
I am fine in seeing her, as long as she understands her thyroid is well controlled at this time  Thanks

## 2020-12-29 NOTE — Telephone Encounter (Signed)
Decline please. Please let her know that her thyroid is normal on the current dose of levothyroxine and I will not be making any changes . Dr. George Hugh plan is correct for her.

## 2020-12-29 NOTE — Telephone Encounter (Signed)
Can we set her up an appointment for next week

## 2020-12-29 NOTE — Telephone Encounter (Signed)
Pt requests transfer of care from Dr. Everardo All, Please Advise.

## 2020-12-30 ENCOUNTER — Other Ambulatory Visit: Payer: Self-pay | Admitting: Endocrinology

## 2020-12-30 ENCOUNTER — Other Ambulatory Visit: Payer: Self-pay | Admitting: Nurse Practitioner

## 2020-12-30 DIAGNOSIS — R1011 Right upper quadrant pain: Secondary | ICD-10-CM

## 2020-12-30 DIAGNOSIS — E038 Other specified hypothyroidism: Secondary | ICD-10-CM

## 2021-01-03 ENCOUNTER — Other Ambulatory Visit: Payer: Self-pay

## 2021-01-03 ENCOUNTER — Other Ambulatory Visit: Payer: Self-pay | Admitting: Family Medicine

## 2021-01-03 ENCOUNTER — Ambulatory Visit (INDEPENDENT_AMBULATORY_CARE_PROVIDER_SITE_OTHER): Payer: 59 | Admitting: Family Medicine

## 2021-01-03 ENCOUNTER — Encounter: Payer: Self-pay | Admitting: Family Medicine

## 2021-01-03 ENCOUNTER — Telehealth: Payer: Self-pay | Admitting: Nurse Practitioner

## 2021-01-03 VITALS — BP 100/66 | HR 65 | Temp 98.4°F | Ht 67.0 in | Wt 168.2 lb

## 2021-01-03 DIAGNOSIS — N63 Unspecified lump in unspecified breast: Secondary | ICD-10-CM | POA: Diagnosis not present

## 2021-01-03 DIAGNOSIS — R29890 Loss of height: Secondary | ICD-10-CM

## 2021-01-03 DIAGNOSIS — Z9882 Breast implant status: Secondary | ICD-10-CM | POA: Diagnosis not present

## 2021-01-03 NOTE — Telephone Encounter (Signed)
Pt is requesting a call back from a nurse to see what she can do since her CT scan was not authorized by her insurance.

## 2021-01-03 NOTE — Telephone Encounter (Signed)
Judith Martinez,  Insurance authorized the CT abd but denied the pelvis.  Auth# [id number]  Valid 01/03/2021 to 02/01/2021

## 2021-01-03 NOTE — Progress Notes (Signed)
Kynzley Dowson T. Autie Vasudevan, MD, CAQ Sports Medicine  Primary Care and Sports Medicine [facility] at [facility] [address] [place], [address]  Phone: [phone]  FAX: [phone]  Judith Martinez - 35 y.o. female  MRN [id number]  Date of Birth: [birth year]  Date: 01/03/2021  PCP: Hannah Beat, MD  Referral: Hannah Beat, MD  Chief Complaint  Patient presents with  . Breast Problem    Right Breast Lump after surgery    This visit occurred during the SARS-CoV-2 public health emergency.  Safety protocols were in place, including screening questions prior to the visit, additional usage of staff PPE, and extensive cleaning of exam room while observing appropriate contact time as indicated for disinfecting solutions.   Subjective:   Judith Martinez is a 35 y.o. very pleasant female patient with Body mass index is 26.35 kg/m. who presents with the following:  Breast lumps s/p breast surgery, all on the right.  She had her breast implants removed by plastic surgeon in [place].  She is having some issues with some palpable lumps, cyst, or masses in the inferior aspect on the right.  These were not present prior to surgery.  They do not hurt.  She has had no trauma, but she has had a few separate breast implant surgeries in the past.  All on the inferior aspect of breast - easily palpated.  diag mammo U/s breast   Review of Systems is noted in the HPI, as appropriate  Objective:   BP 100/66   Pulse 65   Temp 98.4 F (36.9 C) (Temporal)   Ht 5\' 7"  (1.702 m)   Wt 168 lb 4 oz (76.3 kg)   LMP 12/30/2020   SpO2 96%   BMI 26.35 kg/m   GEN: No acute distress; alert,appropriate. PULM: Breathing comfortably in no respiratory distress PSYCH: Normally interactive.   Right-sided breast exam, status post surgery with some palpable areas approximately from 5 till 7 that are nodular/cystic, small masses in multiple locations.  None of these are present on the  left side.  They do not hurt to palpation.  Easily felt.  Laboratory and Imaging Data:  Assessment and Plan:     ICD-10-CM   1. Lump or mass in breast  N63.0 MM DIAG BREAST TOMO BILATERAL    01/01/2021 BREAST COMPLETE UNI RIGHT INC AXILLA  2. Breast implant status  Z98.82 MM DIAG BREAST TOMO BILATERAL    US BREAST COMPLETE UNI RIGHT INC AXILLA  3. Height loss  R29.890 DG Bone Density   Masses/lumps in the 5 to 7 o'clock position on the right breast.  These need to be assessed further for potential complication from surgery or potential neoplasm.  Obtain a diagnostic mammogram with a ultrasound of the right breast.  Orders Placed This Encounter  Procedures  . MM DIAG BREAST TOMO BILATERAL  . US BREAST COMPLETE UNI RIGHT INC AXILLA  . DG Bone Density    Follow-up: No follow-ups on file.  Signed,  [place]. Adellyn Capek, MD   Outpatient Encounter Medications as of 01/03/2021  Medication Sig  . hydrocortisone (ANUSOL-HC) 25 MG suppository Place 1 suppository (25 mg total) rectally at bedtime as needed for hemorrhoids or anal itching.  . linaclotide (LINZESS) 145 MCG CAPS capsule Take 1 capsule (145 mcg total) by mouth daily before breakfast.  . omeprazole (PRILOSEC) 40 MG capsule Take 1 capsule (40 mg total) by mouth daily.  01/05/2021 SYNTHROID 75 MCG tablet Take 1 tablet (75 mcg  total) by mouth daily before breakfast.   No facility-administered encounter medications on file as of 01/03/2021.

## 2021-01-03 NOTE — Telephone Encounter (Signed)
Colleen,Please see message below. If you still want her to have a CT abd/pelvis then you will need to do a peer to peer or just do a CT abdomen. Please advise.

## 2021-01-05 ENCOUNTER — Other Ambulatory Visit: Payer: Self-pay | Admitting: Gastroenterology

## 2021-01-05 ENCOUNTER — Ambulatory Visit: Payer: 59

## 2021-01-06 ENCOUNTER — Other Ambulatory Visit: Payer: Self-pay | Admitting: Nurse Practitioner

## 2021-01-06 ENCOUNTER — Ambulatory Visit
Admission: RE | Admit: 2021-01-06 | Discharge: 2021-01-06 | Disposition: A | Discharge status: Home / Self Care | Payer: 59 | Source: Ambulatory Visit | Attending: Family Medicine | Admitting: Family Medicine

## 2021-01-06 ENCOUNTER — Other Ambulatory Visit: Payer: Self-pay

## 2021-01-06 ENCOUNTER — Other Ambulatory Visit: Payer: Self-pay | Admitting: Family Medicine

## 2021-01-06 ENCOUNTER — Ambulatory Visit: Payer: 59

## 2021-01-06 DIAGNOSIS — N63 Unspecified lump in unspecified breast: Secondary | ICD-10-CM

## 2021-01-06 DIAGNOSIS — R1011 Right upper quadrant pain: Secondary | ICD-10-CM

## 2021-01-06 DIAGNOSIS — R112 Nausea with vomiting, unspecified: Secondary | ICD-10-CM

## 2021-01-06 DIAGNOSIS — R922 Inconclusive mammogram: Secondary | ICD-10-CM | POA: Diagnosis not present

## 2021-01-06 DIAGNOSIS — Z9882 Breast implant status: Secondary | ICD-10-CM

## 2021-01-06 DIAGNOSIS — N6489 Other specified disorders of breast: Secondary | ICD-10-CM | POA: Diagnosis not present

## 2021-01-06 LAB — SARS CORONAVIRUS 2 (TAT 6-24 HRS): SARS Coronavirus 2: NEGATIVE

## 2021-01-06 NOTE — Telephone Encounter (Signed)
Order placed for CT abdomen with oral and IV contrast. [facility] scheduling will contact patient with a date,time and instructions

## 2021-01-06 NOTE — Telephone Encounter (Signed)
Judith Martinez, ok to proceed with abdominal CT with oral and IV contrast since her insurance denied the pelvic component. She mostly has RUQ pain and bloat. She will need to proceed with EGD and colonoscopy as planned. Patient should call our office if she has any lower abdominal pain. Thx

## 2021-01-07 NOTE — Progress Notes (Signed)
Reviewed and agree with management plans. ? ?Kimberly L. Beavers, MD, MPH  ?

## 2021-01-10 ENCOUNTER — Encounter: Payer: Self-pay | Admitting: Gastroenterology

## 2021-01-10 ENCOUNTER — Other Ambulatory Visit: Payer: Self-pay | Admitting: Family Medicine

## 2021-01-10 ENCOUNTER — Other Ambulatory Visit: Payer: Self-pay

## 2021-01-10 ENCOUNTER — Ambulatory Visit (AMBULATORY_SURGERY_CENTER): Payer: 59 | Admitting: Gastroenterology

## 2021-01-10 VITALS — BP 128/45 | HR 67 | Temp 97.7°F | Resp 10 | Ht 67.0 in | Wt 168.0 lb

## 2021-01-10 DIAGNOSIS — R1011 Right upper quadrant pain: Secondary | ICD-10-CM

## 2021-01-10 DIAGNOSIS — K648 Other hemorrhoids: Secondary | ICD-10-CM

## 2021-01-10 DIAGNOSIS — R112 Nausea with vomiting, unspecified: Secondary | ICD-10-CM

## 2021-01-10 DIAGNOSIS — K2970 Gastritis, unspecified, without bleeding: Secondary | ICD-10-CM

## 2021-01-10 DIAGNOSIS — N63 Unspecified lump in unspecified breast: Secondary | ICD-10-CM

## 2021-01-10 DIAGNOSIS — K625 Hemorrhage of anus and rectum: Secondary | ICD-10-CM | POA: Diagnosis not present

## 2021-01-10 DIAGNOSIS — K5900 Constipation, unspecified: Secondary | ICD-10-CM

## 2021-01-10 DIAGNOSIS — K219 Gastro-esophageal reflux disease without esophagitis: Secondary | ICD-10-CM | POA: Diagnosis not present

## 2021-01-10 DIAGNOSIS — K3189 Other diseases of stomach and duodenum: Secondary | ICD-10-CM | POA: Diagnosis not present

## 2021-01-10 MED ORDER — SODIUM CHLORIDE 0.9 % IV SOLN: 500.0000 mL | INTRAVENOUS | Status: DC

## 2021-01-10 NOTE — Progress Notes (Signed)
Called to room to assist during endoscopic procedure.  Patient ID and intended procedure confirmed with present staff. Received instructions for my participation in the procedure from the performing physician.  

## 2021-01-10 NOTE — Progress Notes (Signed)
To PACU, VSS. Report to [email or web address] 

## 2021-01-10 NOTE — Op Note (Signed)
[facility] Patient Name: Judith Martinez Procedure Date: 01/10/2021 2:47 PM MRN: [id number] Endoscopist: Tressia Danas MD, MD Age: 35 Referring MD:  Date of Birth: August 15, 1986 Gender: Female Account #: [id number] Procedure:                Upper GI endoscopy Indications:              Abdominal pain in the right upper quadrant,                            Abdominal bloating, Nausea with vomiting Medicines:                Monitored Anesthesia Care Procedure:                Pre-Anesthesia Assessment:                           - Prior to the procedure, a History and Physical                            was performed, and patient medications and                            allergies were reviewed. The patient's tolerance of                            previous anesthesia was also reviewed. The risks                            and benefits of the procedure and the sedation                            options and risks were discussed with the patient.                            All questions were answered, and informed consent                            was obtained. Prior Anticoagulants: The patient has                            taken no previous anticoagulant or antiplatelet                            agents. ASA Grade Assessment: II - A patient with                            mild systemic disease. After reviewing the risks                            and benefits, the patient was deemed in                            satisfactory condition to undergo the procedure.  After obtaining informed consent, the endoscope was                            passed under direct vision. Throughout the                            procedure, the patient's blood pressure, pulse, and                            oxygen saturations were monitored continuously. The                            Endoscope was introduced through the mouth, and                            advanced to the  third part of duodenum. The upper                            GI endoscopy was accomplished without difficulty.                            The patient tolerated the procedure well. Scope In: Scope Out: Findings:                 The examined esophagus was normal. Biopsies were                            obtained from the proximal and distal esophagus                            with cold forceps for histology.                           Diffuse mild inflammation characterized by                            erythema, friability and granularity was found in                            the gastric body. Biopsies were taken from the                            antrum, body, and fundus with a cold forceps for                            histology. Estimated blood loss was minimal.                           The examined duodenum was normal. Biopsies were                            taken with a cold forceps for histology. Estimated  blood loss was minimal.                           The cardia and gastric fundus were normal on                            retroflexion.                           The exam was otherwise without abnormality. Complications:            No immediate complications. Estimated blood loss:                            Minimal. Estimated Blood Loss:     Estimated blood loss was minimal. Impression:               - Normal esophagus. Biopsied.                           - Gastritis. Biopsied.                           - Normal examined duodenum. Biopsied.                           - The examination was otherwise normal. Recommendation:           - Patient has a contact number available for                            emergencies. The signs and symptoms of potential                            delayed complications were discussed with the                            patient. Return to normal activities tomorrow.                            Written discharge  instructions were provided to the                            patient.                           - Resume previous diet.                           - Continue present medications.                           - Await pathology results.                           - No aspirin, ibuprofen, naproxen, or other                            non-steroidal  anti-inflammatory drugs. Tressia Danas MD, MD 01/10/2021 3:41:33 PM This report has been signed electronically.

## 2021-01-10 NOTE — Patient Instructions (Signed)
Resume previous diet Continue all current medications Await pathology results Avoid all NSAID's ie:  Motrin, ibuprofen, aleve, aspirin, goody powder,   YOU HAD AN ENDOSCOPIC PROCEDURE TODAY AT THE [facility]:   Refer to the procedure report that was given to you for any specific questions about what was found during the examination.  If the procedure report does not answer your questions, please call your gastroenterologist to clarify.  If you requested that your care partner not be given the details of your procedure findings, then the procedure report has been included in a sealed envelope for you to review at your convenience later.  YOU SHOULD EXPECT: Some feelings of bloating in the abdomen. Passage of more gas than usual.  Walking can help get rid of the air that was put into your GI tract during the procedure and reduce the bloating. If you had a lower endoscopy (such as a colonoscopy or flexible sigmoidoscopy) you may notice spotting of blood in your stool or on the toilet paper. If you underwent a bowel prep for your procedure, you may not have a normal bowel movement for a few days.  Please Note:  You might notice some irritation and congestion in your nose or some drainage.  This is from the oxygen used during your procedure.  There is no need for concern and it should clear up in a day or so.  SYMPTOMS TO REPORT IMMEDIATELY:   Following lower endoscopy (colonoscopy or flexible sigmoidoscopy):  Excessive amounts of blood in the stool  Significant tenderness or worsening of abdominal pains  Swelling of the abdomen that is new, acute  Fever of 100F or higher   Following upper endoscopy (EGD)  Vomiting of blood or coffee ground material  New chest pain or pain under the shoulder blades  Painful or persistently difficult swallowing  New shortness of breath  Fever of 100F or higher  Black, tarry-looking stools  For urgent or emergent issues, a gastroenterologist can  be reached at any hour by calling [phone]. Do not use MyChart messaging for urgent concerns.   DIET:  We do recommend a small meal at first, but then you may proceed to your regular diet.  Drink plenty of fluids but you should avoid alcoholic beverages for 24 hours. ACTIVITY:  You should plan to take it easy for the rest of today and you should NOT DRIVE or use heavy machinery until tomorrow (because of the sedation medicines used during the test).    FOLLOW UP: Our staff will call the number listed on your records 48-72 hours following your procedure to check on you and address any questions or concerns that you may have regarding the information given to you following your procedure. If we do not reach you, we will leave a message.  We will attempt to reach you two times.  During this call, we will ask if you have developed any symptoms of COVID 19. If you develop any symptoms (ie: fever, flu-like symptoms, shortness of breath, cough etc.) before then, please call [phone].  If you test positive for Covid 19 in the 2 weeks post procedure, please call and report this information to [place].    If any biopsies were taken you will be contacted by phone or by letter within the next 1-3 weeks.  Please call us at [phone] if you have not heard about the biopsies in 3 weeks.   SIGNATURES/CONFIDENTIALITY: You and/or your care partner have signed paperwork which will  be entered into your electronic medical record.  These signatures attest to the fact that that the information above on your After Visit Summary has been reviewed and is understood.  Full responsibility of the confidentiality of this discharge information lies with you and/or your care-partner. 

## 2021-01-10 NOTE — Op Note (Signed)
[facility] Patient Name: Judith Martinez Procedure Date: 01/10/2021 2:46 PM MRN: [id number] Endoscopist: Tressia Danas MD, MD Age: 35 Referring MD:  Date of Birth: [birth year] Gender: Female Account #: [id number] Procedure:                Colonoscopy Indications:              Rectal bleeding                           Incidental symptoms: Constipation and bloating                           Paternal grandmother and paternal grandfather with                            colon polyps Medicines:                Monitored Anesthesia Care Procedure:                Pre-Anesthesia Assessment:                           - Prior to the procedure, a History and Physical                            was performed, and patient medications and                            allergies were reviewed. The patient's tolerance of                            previous anesthesia was also reviewed. The risks                            and benefits of the procedure and the sedation                            options and risks were discussed with the patient.                            All questions were answered, and informed consent                            was obtained. Prior Anticoagulants: The patient has                            taken no previous anticoagulant or antiplatelet                            agents. ASA Grade Assessment: II - A patient with                            mild systemic disease. After reviewing the risks                            and  benefits, the patient was deemed in                            satisfactory condition to undergo the procedure.                           After obtaining informed consent, the colonoscope                            was passed under direct vision. Throughout the                            procedure, the patient's blood pressure, pulse, and                            oxygen saturations were monitored continuously. The                             Colonoscope was introduced through the anus and                            advanced to the 3 cm into the ileum. The                            colonoscopy was performed without difficulty. The                            patient tolerated the procedure well. The quality                            of the bowel preparation was unsatisfactory. The                            terminal ileum, ileocecal valve, appendiceal                            orifice, and rectum were photographed. Scope In: 3:15:02 PM Scope Out: 3:30:41 PM Scope Withdrawal Time: 0 hours 9 minutes 20 seconds  Total Procedure Duration: 0 hours 15 minutes 39 seconds  Findings:                 Non-bleeding external and internal hemorrhoids were                            found.                           A moderate amount of liquid stool was found in the                            entire colon, interfering with visualization.                           The exam was otherwise without abnormality on  direct and retroflexion views. Complications:            No immediate complications. Estimated Blood Loss:     Estimated blood loss: none. Impression:               - Preparation of the colon was unsatisfactory.                           - Non-bleeding external and internal hemorrhoids.                           - Stool in the entire examined colon limiting an                            evaluation for small and medium-sized lesions.                           - The examination was otherwise normal on direct                            and retroflexion views. Recommendation:           - Patient has a contact number available for                            emergencies. The signs and symptoms of potential                            delayed complications were discussed with the                            patient. Return to normal activities tomorrow.                            Written discharge instructions were  provided to the                            patient.                           - Resume previous diet.                           - Continue present medications including Linzess                            145 mcg daily.                           - Repeat colonoscopy at age 33 for screening                            purposes, earlier with new symptoms. Plan 2 day                            bowel prep at that time. Tressia Danas MD, MD 01/10/2021 3:47:50 PM This report  has been signed electronically.

## 2021-01-11 ENCOUNTER — Telehealth: Payer: Self-pay

## 2021-01-11 NOTE — Telephone Encounter (Signed)
-----   Message from Tressia Danas, MD sent at 01/10/2021  3:50 PM EST ----- Please schedule follow-up with [facility] - next available. Thanks.  KLB

## 2021-01-11 NOTE — Telephone Encounter (Signed)
Pt scheduled to see Colleen 01/24/21@3pm . Pt notified via mychart and letter mailed to pt.

## 2021-01-12 ENCOUNTER — Telehealth: Payer: Self-pay

## 2021-01-12 NOTE — Telephone Encounter (Signed)
  Follow up Call-  Call back number 01/10/2021  Post procedure Call Back phone  # [phone]  Permission to leave phone message Yes  Some recent data might be hidden     Patient questions:  Do you have a fever, pain , or abdominal swelling? No. Pain Score  0 *  Have you tolerated food without any problems? Yes.    Have you been able to return to your normal activities? Yes.    Do you have any questions about your discharge instructions: Diet   No. Medications  No. Follow up visit  No.  Do you have questions or concerns about your Care? No.  Actions: * If pain score is 4 or above: No action needed, pain <4. 1. Have you developed a fever since your procedure? no  2.   Have you had an respiratory symptoms (SOB or cough) since your procedure? no  3.   Have you tested positive for COVID 19 since your procedure no  4.   Have you had any family members/close contacts diagnosed with the COVID 19 since your procedure?  no   If yes to any of these questions please route to Laverna Peace, RN and Karlton Lemon, RN

## 2021-01-17 ENCOUNTER — Ambulatory Visit (HOSPITAL_COMMUNITY)
Admission: RE | Admit: 2021-01-17 | Discharge: 2021-01-17 | Disposition: A | Discharge status: Home / Self Care | Payer: 59 | Source: Ambulatory Visit | Attending: Nurse Practitioner | Admitting: Nurse Practitioner

## 2021-01-17 ENCOUNTER — Other Ambulatory Visit: Payer: Self-pay

## 2021-01-17 ENCOUNTER — Encounter (HOSPITAL_COMMUNITY): Payer: Self-pay

## 2021-01-17 DIAGNOSIS — R112 Nausea with vomiting, unspecified: Secondary | ICD-10-CM | POA: Diagnosis not present

## 2021-01-17 DIAGNOSIS — R1011 Right upper quadrant pain: Secondary | ICD-10-CM | POA: Diagnosis not present

## 2021-01-17 MED ORDER — IOHEXOL 300 MG/ML  SOLN
100.0000 mL | Freq: Once | INTRAMUSCULAR | Status: AC | PRN
  Administered 2021-01-17: 100 mL via INTRAVENOUS

## 2021-01-18 ENCOUNTER — Ambulatory Visit
Admission: RE | Admit: 2021-01-18 | Discharge: 2021-01-18 | Disposition: A | Discharge status: Home / Self Care | Payer: 59 | Source: Ambulatory Visit | Attending: Family Medicine | Admitting: Family Medicine

## 2021-01-18 DIAGNOSIS — M791 Myalgia, unspecified site: Secondary | ICD-10-CM | POA: Diagnosis not present

## 2021-01-18 DIAGNOSIS — K900 Celiac disease: Secondary | ICD-10-CM | POA: Diagnosis not present

## 2021-01-18 DIAGNOSIS — R768 Other specified abnormal immunological findings in serum: Secondary | ICD-10-CM | POA: Diagnosis not present

## 2021-01-18 DIAGNOSIS — N6315 Unspecified lump in the right breast, overlapping quadrants: Secondary | ICD-10-CM | POA: Diagnosis not present

## 2021-01-18 DIAGNOSIS — N63 Unspecified lump in unspecified breast: Secondary | ICD-10-CM

## 2021-01-18 DIAGNOSIS — M2550 Pain in unspecified joint: Secondary | ICD-10-CM | POA: Diagnosis not present

## 2021-01-20 ENCOUNTER — Other Ambulatory Visit: Payer: Self-pay | Admitting: Nurse Practitioner

## 2021-01-20 MED ORDER — DICYCLOMINE HCL 10 MG PO CAPS: 10.0000 mg | ORAL_CAPSULE | Freq: Four times a day (QID) | ORAL | 0 refills | Status: DC | PRN

## 2021-01-20 NOTE — Telephone Encounter (Signed)
Judith Martinez, I called the patient and we discussed her abdominal CT findings, EGD and colonoscopy procedure reports.  I explained Dr. Orvan Falconer has not yet reviewed her EGD/colonoscopy biopsy results.  Esophageal biopsies show reflux esophagitis. The duodenal biopsies indicate celiac disease.  She stated she is already been on a gluten-free diet prior to the EGD.  I explained to the patient she may have hidden gluten exposure which I will discuss in much further detail the time of her follow-up appointment scheduled on 3/14.  We will also provide the patient with Dr. Orvan Falconer' recommendations after she reviews the biopsy results.  Colonoscopy with inadequate prep. She denies having any severe abdominal pain at this time. She has intermittent watery diarrhea.   Please send in a prescription for Dicyclomine 10 mg 1 p.o. 4 times daily as needed for abdominal pain #30, no refills.  I advised the patient to call our office if her symptoms worsen and not to use the MyChart messaging if she has worsening/severe symptoms.

## 2021-01-21 DIAGNOSIS — Z8249 Family history of ischemic heart disease and other diseases of the circulatory system: Secondary | ICD-10-CM

## 2021-01-23 NOTE — Progress Notes (Signed)
01/23/2021 Judith Martinez [id number] [birth year]   Chief Complaint: Abdominal pain, constipation  History of Present Illness: [name] is a 35 year old female with a past medical history of  pyelonephritis, migraine headaches, Hashimoto's thyroiditis and chronic constipation. Past tonsillectomy, tubal ligation, tubal reversal and breast augmentation surgery and s/p breast implant removal.  I initially saw her in the office on 12/28/2020 due to having heartburn, nausea, early satiety, right upper quadrant abdominal pain and rectal bleeding. She presents today for further evaluation after completing an EGD and colonoscopy. She is frustrated due to the ongoing right upper quadrant abdominal pain and constipation.  She is taking Linzess 145 mcg 2 tabs p.o. daily which is ineffective.  She is taking prune lax 8 tablets at bedtime which results in abdominal cramping and 3 to 4 loose  movements throughout the night.  However, if she does not take the prune lax she does not have a bowel movement and she feels worse at that time.  She is also taking a magnesium supplement, I prebiotic and a probiotic.  She continues to have intermittent rectal discomfort and bleeding.  She stated being 100% compliant with a gluten-free diet.  She verified her shampoos, soaps and make-up are all gluten-free.  She cooks all of her meals and does not eat food away from home.  She is concerned that if she has celiac disease she continues to have the same symptoms despite being on 100% gluten-free diet.  She remains on Omeprazole 40 mg daily.  She is taking Dicyclomine 10 to 20 mg p.o. twice daily for her abdominal pain.  IBgard was ineffective.  She was seen by rheumatologist Dr. Ephriam Jenkins at the [facility] 01/18/2021 due to having a positive ANA level with chronic back, shoulder, knee and feet pain with associated joint swelling.  She has a history of numerous miscarriages.  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  She is on naltrexone and gabapentin for her pain.  She was referred to hematology for further evaluation regarding the positive anticardiolipin antibody.  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  She is having difficulty losing weight which she attributes to her thyroid disease.  She last saw her gynecologist approximately 5 or 6 months ago and she reported pelvic exam was normal.  CBC Latest Ref Rng & Units 12/28/2020 12/03/2019 06/02/2019  WBC 4.0 - 10.5 K/uL 7.2 6.7 5.6  Hemoglobin 12.0 - 15.0 g/dL 13.6 13.9 14.4  Hematocrit 36.0 - 46.0 % 40.5 41.7 44.3  Platelets 150.0 - 400.0 K/uL 281.0 243.0 286   CMP Latest Ref Rng & Units 12/28/2020 12/03/2019 06/02/2019  Glucose 70 - 99 mg/dL 91 77 111(H)  BUN 6 - 23 mg/dL '9 14 16  ' Creatinine 0.40 - 1.20 mg/dL 0.83 0.83 0.85  Sodium 135 - 145 mEq/L 136 138 140  Potassium 3.5 - 5.1 mEq/L 3.5 3.6 3.7  Chloride 96 - 112 mEq/L 103 103 107  CO2 19 - 32 mEq/L '27 27 24  ' Calcium 8.4 - 10.5 mg/dL 9.8 9.9 9.9  Total Protein 6.0 - 8.3 g/dL 7.3 7.3 7.9  Total Bilirubin 0.2 - 1.2 mg/dL 0.6 0.5 0.6  Alkaline Phos 39 - 117 U/L 40 47 52  AST 0 - 37 U/L '16 19 25  ' ALT 0 - 35 U/L '15 20 31  ' CRP less than 1.0.  Abdominal  CT with oral and IV contrast 01/17/2021: 1. Mild diffuse hepatic steatosis. 2. Formed stool throughout the visualized colon.  No evidence of small bowel dilatation.  No evidence of bowel wall thickening or inflammation. 3.  Normal pancreas.  EGD 01/10/2021: - Normal esophagus. Biopsied. - Gastritis. Biopsied. - Normal examined duodenum. Biopsied. - The examination was otherwise normal  Colonoscopy 01/10/2021: - Preparation of the colon was unsatisfactory. - Non-bleeding external and internal hemorrhoids. - Stool in the entire examined colon limiting an evaluation for small and medium-sized lesions. - The examination was  otherwise normal on direct and retroflexion views. Biopsy Report: 1. Surgical [P], duodenum - DUODENAL MUCOSA WITH INTRAEPITHELIAL LYMPHOCYTOSIS AND MILD VILLOUS ARCHITECTURAL CHANGES. SEE NOTE 2. Surgical [P], fundus, gastric antrum and gastric body - GASTRIC ANTRAL AND OXYNTIC MUCOSA WITH NO SPECIFIC HISTOPATHOLOGIC CHANGES - WARTHIN STARRY STAIN IS NEGATIVE FOR HELICOBACTER PYLORI 3. Surgical [P], distal esophagus - ESOPHAGEAL SQUAMOUS AND CARDIAC MUCOSA WITH REFLUX-ASSOCIATED CHANGES - NEGATIVE FOR INTESTINAL METAPLASIA OR DYSPLASIA 4. Surgical [P], mid and proximal esophagus - ESOPHAGEAL SQUAMOUS MUCOSA WITH MILD VASCULAR CONGESTION, AND FOCAL SQUAMOUS BALLOONING, SUGGESTIVE OF REFLUX ESOPHAGITIS - NEGATIVE FOR INCREASED INTRAEPITHELIAL EOSINOPHILS Diagnosis Note 1. The histologic findings appear compatible with celiac disease in an appropriate clinical setting. Differential diagnosis can include tropical sprue, food allergy, immune deficiencies (e.g. IgA deficiency, common variable immunodeficiency), viral enteritis, Giardiasis, blind loop syndrome, Crohn disease, autoimmune enteropathy and drugs (e.g. NSAIDs).   Current Outpatient Medications on File Prior to Visit  Medication Sig Dispense Refill  . dicyclomine (BENTYL) 10 MG capsule Take 1 capsule (10 mg total) by mouth 4 (four) times daily as needed for spasms. 30 capsule 0  . gabapentin (NEURONTIN) 100 MG capsule Take 100 mg by mouth 2 (two) times daily.    . hydrocortisone (ANUSOL-HC) 25 MG suppository Place 1 suppository (25 mg total) rectally at bedtime as needed for hemorrhoids or anal itching. 5 suppository 0  . hydrOXYzine (ATARAX/VISTARIL) 50 MG tablet Take 50 mg by mouth 2 (two) times daily.    Marland Kitchen linaclotide (LINZESS) 145 MCG CAPS capsule Take 1 capsule (145 mcg total) by mouth daily before breakfast. 30 capsule 2  . LORazepam (ATIVAN) 1 MG tablet See admin instructions.    Marland Kitchen omeprazole (PRILOSEC) 40 MG capsule Take 1  capsule (40 mg total) by mouth daily. 90 capsule 3  . SYNTHROID 75 MCG tablet Take 1 tablet (75 mcg total) by mouth daily before breakfast. 90 tablet 3  . venlafaxine XR (EFFEXOR-XR) 75 MG 24 hr capsule Take 75 mg by mouth daily.     No current facility-administered medications on file prior to visit.   No Known Allergies  Current Medications, Allergies, Past Medical History, Past Surgical History, Family History and Social History were reviewed in [employer] record.  Review of Systems:   Constitutional: Negative for fever, sweats, chills or weight loss.  Respiratory: Negative for shortness of breath.   Cardiovascular: Negative for chest pain, palpitations and leg swelling.  Gastrointestinal: See HPI.  Musculoskeletal: See HPI.  Neurological: Negative for dizziness, headaches or paresthesias.   Physical Exam: LMP 01/06/2021 Comment: no chance of pregnancy per pt  BP 124/72   Pulse 74   Ht '5\' 7"'  (1.702 m)   Wt 163 lb (73.9 kg)   LMP 01/06/2021 Comment: no chance of pregnancy per pt  BMI 25.53 kg/m   General: 35 year old female in no acute distress. Head: Normocephalic and atraumatic. Eyes: No scleral icterus. Conjunctiva pink .  Ears: Normal auditory acuity. Mouth: Lungs: Clear throughout to auscultation. Heart: Regular rate and rhythm, no murmur. Abdomen: Soft, nondistended. Mild RUQ and generalized tenderness without rebound or guarding. No masses or hepatomegaly. Normal bowel sounds x 4 quadrants.  Rectal: Deferred.  Musculoskeletal: Symmetrical with no gross deformities. Extremities: No edema. Neurological: Alert oriented x 4. No focal deficits.  Psychological: Alert and cooperative. Normal mood and affect  Assessment and Recommendations:  24.  35 year old female with right upper quadrant abdominal pain. Laboratory studies 12/28/2020 showed an elevated TTG IgA antibody level of 116.1.  EGD 01/10/2021 identified reflux esophagitis and duodenal biopsies  showed evidence of intraepithelial lymphocytosis with mild villous architecture most likely a secondary to celiac disease.  She was on a gluten-free diet at the time of her EGD.  Abdominal CT 01/17/2021 showed stool filled colon and hepatic steatosis.  -Celiac HLA DQ2 and DQ8 -If Celiac HLA DQ2/DQ8 levels normal consider food allergy testing and designated small bowel MRI vs small bowel capsule endoscopy to further evaluate the small bowel, rule out small bowel Crohn's disease  -Continue Dicyclomine 10 to 20 mg p.o. twice daily as needed -Continue gluten-free diet  2.  Chronic constipation with generalized abdominal pain.  Nighttime abdominal pain most likely due to taking 8 prune lax at bedtime.  Linzess 128mg -> 2942m po QD has been ineffective.  Colonoscopy resulted in poor prep without evidence of obvious colitis. -Stop Linzess -Amitiza 24 mcg 1 p.o. twice daily -Smooth move tea daily as needed  3.  Rectal bleeding, most likely due to internal and external hemorrhoids - Apply a small amount of Desitin inside the anal opening and to the external anal area tid as needed for anal or hemorrhoidal irritation/bleeding.  -See plan in # 2  4. Reflux esophagitis -Continue Omeprazole 40 mg daily  5. Hashimoto's thyroiditis, + ANA, undergoing rheumatology evaluation   6. 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 Hepatic steatosis per CTAP. 01/17/2021. Normal LFTs.   5. Colon cancer screening -Next colonoscopy due age [age]Patient to call our office if her symptoms worsen Follow-up in the office with Dr. BeTarri Glennn 4 weeks

## 2021-01-24 ENCOUNTER — Encounter: Payer: Self-pay | Admitting: Nurse Practitioner

## 2021-01-24 ENCOUNTER — Other Ambulatory Visit: Payer: 59

## 2021-01-24 ENCOUNTER — Ambulatory Visit (INDEPENDENT_AMBULATORY_CARE_PROVIDER_SITE_OTHER): Payer: 59 | Admitting: Nurse Practitioner

## 2021-01-24 VITALS — BP 124/72 | HR 74 | Ht 67.0 in | Wt 163.0 lb

## 2021-01-24 DIAGNOSIS — F320 Major depressive disorder, single episode, mild: Secondary | ICD-10-CM

## 2021-01-24 DIAGNOSIS — K5900 Constipation, unspecified: Secondary | ICD-10-CM | POA: Diagnosis not present

## 2021-01-24 DIAGNOSIS — K900 Celiac disease: Secondary | ICD-10-CM | POA: Diagnosis not present

## 2021-01-24 NOTE — Patient Instructions (Addendum)
If you are age 34 or younger, your body mass index should be between 19-25. Your Body mass index is 25.53 kg/m. If this is out of the aformentioned range listed, please consider follow up with your Primary Care Provider.   LABS:  Lab work has been ordered for you today. Our lab is located in the basement. Press "B" on the elevator. The lab is located at the first door on the left as you exit the elevator.  HEALTHCARE LAWS AND MY CHART RESULTS: Due to recent changes in healthcare laws, you may see the results of your imaging and laboratory studies on MyChart before your provider has had a chance to review them.   We understand that in some cases there may be results that are confusing or concerning to you. Not all laboratory results come back in the same time frame and the provider may be waiting for multiple results in order to interpret others.  Please give [place] 48 hours in order for your provider to thoroughly review all the results before contacting the office for clarification of your results.   Stop Linzess  Take Amitiza 24 mcg twice a day.  Drink Smooth Move Tea once a day as needed.  We have scheduled you a follow up with Dr. Orvan Falconer 03/01/21 at 3:40pm.  It was great seeing you today! Thank you for entrusting me with your care and choosing [facility].  Arnaldo Natal, CRNP

## 2021-01-25 ENCOUNTER — Telehealth: Payer: Self-pay

## 2021-01-25 NOTE — Telephone Encounter (Signed)
Covering nurse for [employer],  Pls call the patient and let her know if she is having chest pain she should go to the local ED for further evaluation. Pls inform the patient if she is having chest pain she should go to the ED for further evaluation. If she declines to do so she needs to call her PCP. Pls inform the patient not to use my char messaging for urgent symptoms ie: chest pain.

## 2021-01-25 NOTE — Telephone Encounter (Signed)
Spoke with patient and encouraged her to go to the ED, per [facility], for her chest pain; if she did not want to do this I told her to call her pcp.  Also reminded her to call with urgent symptoms as opposed to using mychart.  Patient agreed.

## 2021-01-27 ENCOUNTER — Ambulatory Visit (INDEPENDENT_AMBULATORY_CARE_PROVIDER_SITE_OTHER): Payer: 59 | Admitting: Cardiology

## 2021-01-27 ENCOUNTER — Other Ambulatory Visit: Payer: Self-pay

## 2021-01-27 ENCOUNTER — Encounter: Payer: Self-pay | Admitting: Cardiology

## 2021-01-27 VITALS — BP 120/84 | HR 88 | Ht 67.0 in | Wt 164.0 lb

## 2021-01-27 DIAGNOSIS — R0600 Dyspnea, unspecified: Secondary | ICD-10-CM

## 2021-01-27 DIAGNOSIS — E7800 Pure hypercholesterolemia, unspecified: Secondary | ICD-10-CM

## 2021-01-27 DIAGNOSIS — R072 Precordial pain: Secondary | ICD-10-CM | POA: Diagnosis not present

## 2021-01-27 DIAGNOSIS — R0609 Other forms of dyspnea: Secondary | ICD-10-CM

## 2021-01-27 MED ORDER — LUBIPROSTONE 24 MCG PO CAPS: 24.0000 ug | ORAL_CAPSULE | Freq: Two times a day (BID) | ORAL | 3 refills | Status: DC

## 2021-01-27 NOTE — Patient Instructions (Signed)
Medication Instructions:   Your physician recommends that you continue on your current medications as directed. Please refer to the Current Medication list given to you today.  *If you need a refill on your cardiac medications before your next appointment, please call your pharmacy*   Lab Work: None ordered If you have labs (blood work) drawn today and your tests are completely normal, you will receive your results only by: . MyChart Message (if you have MyChart) OR . A paper copy in the mail If you have any lab test that is abnormal or we need to change your treatment, we will call you to review the results.   Testing/Procedures:  1.   Your physician has requested that you have an echocardiogram. Echocardiography is a painless test that uses sound waves to create images of your heart. It provides your doctor with information about the size and shape of your heart and how well your heart's chambers and valves are working. This procedure takes approximately one hour. There are no restrictions for this procedure.  2.  [facility] MYOVIEW       Your caregiver has ordered a Stress Test with nuclear imaging. The purpose of this test is to evaluate the blood supply to your heart muscle. This procedure is referred to as a "Non-Invasive Stress Test." This is because other than having an IV started in your vein, nothing is inserted or "invades" your body. Cardiac stress tests are done to find areas of poor blood flow to the heart by determining the extent of coronary artery disease (CAD). Some patients exercise on a treadmill, which naturally increases the blood flow to your heart, while others who are  unable to walk on a treadmill due to physical limitations have a pharmacologic/chemical stress agent called Lexiscan . This medicine will mimic walking on a treadmill by temporarily increasing your coronary blood flow.      PLEASE REPORT TO [facility]TR[facility]   THE VOLUNTEERS AT THE FIRST DESK WILL  DIRECT YOU WHERE TO GO     *Please note: these test may take anywhere between 2-4 hours to complete       Date of Procedure:_____________________________________   Arrival Time for Procedure:______________________________    PLEASE NOTIFY THE OFFICE AT LEAST 24 HOURS IN ADVANCE IF YOU ARE UNABLE TO KEEP YOUR APPOINTMENT.  [phone]  PLEASE NOTIFY NUCLEAR MEDICINE AT [facility] AT LEAST 24 HOURS IN ADVANCE IF YOU ARE UNABLE TO KEEP YOUR APPOINTMENT. [phone]         How to prepare for your Myoview test:     1. Do not eat or drink after midnight  2. No caffeine for 24 hours prior to test  3. No smoking 24 hours prior to test.  4. Unless instructed otherwise, Take your medication with a small sips of water.    5.         Ladies, please do not wear dresses. Skirts or pants are appropriate. Please wear a short sleeve shirt.  6. No perfume, cologne or lotion.  7. Wear comfortable walking shoes. No heels!       Follow-Up: At [facility], you and your health needs are our priority.  As part of our continuing mission to provide you with exceptional heart care, we have created designated Provider Care Teams.  These Care Teams include your primary Cardiologist (physician) and Advanced Practice Providers (APPs -  Physician Assistants and Nurse Practitioners) who all work together to provide you with the care you need,   when you need it.  We recommend signing up for the patient portal called "MyChart".  Sign up information is provided on this After Visit Summary.  MyChart is used to connect with patients for Virtual Visits (Telemedicine).  Patients are able to view lab/test results, encounter notes, upcoming appointments, etc.  Non-urgent messages can be sent to your provider as well.   To learn more about what you can do with MyChart, go to [email or web address].    Your next appointment:   Follow up after testing   The format for your next appointment:   In Person  Provider:    Brian Agbor-Etang, MD   Other Instructions   
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
## 2021-01-31 ENCOUNTER — Encounter: Payer: Self-pay | Admitting: Oncology

## 2021-01-31 ENCOUNTER — Encounter: Payer: Self-pay | Admitting: Internal Medicine

## 2021-01-31 ENCOUNTER — Inpatient Hospital Stay: Payer: 59 | Attending: Oncology | Admitting: Oncology

## 2021-01-31 ENCOUNTER — Inpatient Hospital Stay: Payer: 59

## 2021-01-31 VITALS — BP 122/79 | HR 89 | Temp 99.6°F | Resp 20 | Wt 163.4 lb

## 2021-01-31 DIAGNOSIS — M545 Low back pain, unspecified: Secondary | ICD-10-CM | POA: Diagnosis not present

## 2021-01-31 DIAGNOSIS — G8929 Other chronic pain: Secondary | ICD-10-CM | POA: Diagnosis not present

## 2021-01-31 DIAGNOSIS — R760 Raised antibody titer: Secondary | ICD-10-CM | POA: Insufficient documentation

## 2021-01-31 DIAGNOSIS — O2621 Pregnancy care for patient with recurrent pregnancy loss, first trimester: Secondary | ICD-10-CM | POA: Diagnosis not present
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
## 2021-02-01 ENCOUNTER — Encounter: Admission: RE | Admit: 2021-02-01 | Payer: 59 | Source: Ambulatory Visit

## 2021-02-01 ENCOUNTER — Telehealth: Payer: Self-pay

## 2021-02-01 DIAGNOSIS — R072 Precordial pain: Secondary | ICD-10-CM

## 2021-02-01 NOTE — Telephone Encounter (Signed)
Called patient and informed her of the Exercise Stress Test that we ordered and reviewed instructions with her as seen below. Will also send to patients MyChart.  Will send to scheduling to schedule test and inform patient of date to get COVID test.   - you may eat a light breakfast/ lunch prior to your procedure - no caffeine for 24 hours prior to your test (coffee, tea, soft drinks, or chocolate)  - you may take your regular medications the day of your test   - wear comfortable clothing & tennis/ non-skid shoes to walk on the treadmill  COVID PRE- TEST: You will need a COVID TEST prior to the procedure:  LOCATION: [facility] Pre-Op Admission Walk In.              DATE/TIME: _______(8:00 am- 10:00 am)

## 2021-02-01 NOTE — Telephone Encounter (Signed)
The pt is pregnant and I've never seen here before. She transferred care from [place] to me. PLease bring her to see me today or tomorrow the latest . Make sure you tell her I am in [place] for today   Offer open slots first , then acute   Thanks

## 2021-02-01 NOTE — Progress Notes (Signed)
Reviewed and agree with management plans. ? ?Joby Richart L. Otilia Kareem, MD, MPH  ?

## 2021-02-01 NOTE — Telephone Encounter (Signed)
-----   Message from Debbe Odea, MD sent at 01/31/2021  1:49 PM EDT ----- Regarding: schedule ETT Lexiscan Myoview not able to be performed as patient is currently pregnant.  Please schedule patient for exercise treadmill test to evaluate presence of inducible ischemia.    Thank you BA

## 2021-02-01 NOTE — Telephone Encounter (Signed)
Pt scheduled to see Dr.Shamleffer tomorrow 3/23 at 4:00

## 2021-02-01 NOTE — Telephone Encounter (Signed)
Attempted to schedule.  

## 2021-02-02 ENCOUNTER — Encounter: Payer: Self-pay | Admitting: Internal Medicine

## 2021-02-02 ENCOUNTER — Other Ambulatory Visit: Payer: Self-pay

## 2021-02-02 ENCOUNTER — Ambulatory Visit: Payer: 59 | Admitting: Internal Medicine

## 2021-02-02 VITALS — BP 112/70 | HR 74 | Resp 18 | Ht 65.0 in | Wt 164.4 lb

## 2021-02-02 DIAGNOSIS — E063 Autoimmune thyroiditis: Secondary | ICD-10-CM | POA: Diagnosis not present

## 2021-02-02 DIAGNOSIS — E038 Other specified hypothyroidism: Secondary | ICD-10-CM

## 2021-02-02 LAB — CELIAC DISEASE HLA DQ ASSOC.
DQ2 (DQA1 0501/0505,DQB1 02XX): POSITIVE
DQ8 (DQA1 03XX, DQB1 0302): NEGATIVE

## 2021-02-02 LAB — TSH: TSH: 0.45 u[IU]/mL (ref 0.35–4.50)

## 2021-02-02 NOTE — Progress Notes (Signed)
Name: Judith Martinez  MRN/ DOB: [id number], [birth year]    Age/ Sex: 35 y.o., female     PCP: Hannah Beat, MD   Reason for Endocrinology Evaluation: Hypothyroidism     Initial Endocrinology Clinic Visit: 07/26/2020    PATIENT IDENTIFIER: Judith Martinez is a 35 y.o., female with a past medical history of Hashimoto's thyroiditis, celiac disease, Fatty liver  And fibromyalgia.  She has followed with [facility] since 07/26/2020 for consultative assistance with management of her Hypothyroidism.   HISTORICAL SUMMARY:  She was diagnosed with Hashimoto's Thyroiditis 06/2020. Started on Lt-4 replacement . She was tired at the time as well as lightheadedness as well as weight gain. TSH was high as 4  .    Thyroid Ultrasound 07/2020 non- revealing   No FH of thyroid disease    Follows with rheumatology for fibromyalgia, on Naltrexone and Gabapentin   SUBJECTIVE:    Today (02/02/2021):  Ms. Gathright is here for a follow up on Hypothyroidism. She is here to establish care.   Pt has 5 kids prior to her current marriage, and has been trying to conceive for 6 yrs since her marriage without success. She had a tubal ligation reversal in 2015   She states 12 " chemical pregnancies" but no viable and is under the impression that  Her thyroid has to do with it, highest TSH per was was 4.0 uIU/mL prior to initiating LT- 4 replacement    Continues with fatigue no change from before starting LT- 4 replacement  Has chronic sore throat and raspy voice at times Ultrasound unrevealing   LMP 2/17/ 2022    Medication:  Synthroid 75 mcg daily    HISTORY:  Past Medical History:  Past Medical History:  Diagnosis Date  . Arthritis    knees  . Fibromyalgia   . GERD (gastroesophageal reflux disease)   . History of kidney stones 2013  . History of pyelonephritis 2012  . Hx of migraines   . Thyroid disease    Past Surgical History:  Past Surgical History:  Procedure  Laterality Date  . BREAST ENHANCEMENT SURGERY  2012  . BREAST IMPLANT REMOVAL    . TONSILLECTOMY  1994  . TUBAL LIGATION  10/13  . tubal reversal  2016    Social History:  reports that she has never smoked. She has never used smokeless tobacco. She reports current alcohol use. She reports that she does not use drugs. Family History:  Family History  Problem Relation Age of Onset  . Hypertension Father   . Migraines Father   . Hyperlipidemia Father   . CAD Paternal Grandfather   . Parkinson's disease Paternal Grandfather   . Cancer Paternal Grandfather        leukemia  . Stroke Paternal Grandfather   . Colon polyps Paternal Grandfather   . Cancer Sister 4       tumor in leg  . Cancer Other        maternal great grandmother; breast  . Diabetes Paternal Grandmother   . Colon polyps Paternal Grandmother   . Fibromyalgia Mother   . Colon cancer Maternal Grandfather   . Thyroid cancer Neg Hx   . Pancreatic cancer Neg Hx   . Esophageal cancer Neg Hx      HOME MEDICATIONS: Allergies as of 02/02/2021   No Known Allergies     Medication List       Accurate as of February 02, 2021  3:58 PM.  If you have any questions, ask your nurse or doctor.        dicyclomine 10 MG capsule Commonly known as: BENTYL Take 1 capsule (10 mg total) by mouth 4 (four) times daily as needed for spasms.   gabapentin 300 MG capsule Commonly known as: NEURONTIN Take 300 mg by mouth 3 (three) times daily.   hydrocortisone 25 MG suppository Commonly known as: ANUSOL-HC Place 1 suppository (25 mg total) rectally at bedtime as needed for hemorrhoids or anal itching.   hydrOXYzine 50 MG tablet Commonly known as: ATARAX/VISTARIL Take 50 mg by mouth 2 (two) times daily.   lubiprostone 24 MCG capsule Commonly known as: AMITIZA Take 1 capsule (24 mcg total) by mouth 2 (two) times daily with a meal.   omeprazole 40 MG capsule Commonly known as: PRILOSEC Take 1 capsule (40 mg total) by mouth  daily.   Synthroid 75 MCG tablet Generic drug: levothyroxine Take 1 tablet (75 mcg total) by mouth daily before breakfast.   venlafaxine XR 75 MG 24 hr capsule Commonly known as: EFFEXOR-XR Take 75 mg by mouth daily.         OBJECTIVE:   PHYSICAL EXAM: VS: BP 112/70 (BP Location: Left Arm, Patient Position: Sitting, Cuff Size: Normal)   Pulse 74   Resp 18   Ht 5\' 5"  (1.651 m)   Wt 164 lb 6.4 oz (74.6 kg)   LMP 01/06/2021 Comment: five weeks pregnant  SpO2 96%   BMI 27.36 kg/m    EXAM: General: Pt tearful   Neck: General: Supple without adenopathy. Thyroid: Thyroid size normal.  No goiter or nodules appreciated. No thyroid bruit.  Lungs: Clear with good BS bilat with no rales, rhonchi, or wheezes  Heart: Auscultation: RRR.  Abdomen: Normoactive bowel sounds, soft, nontender, without masses or organomegaly palpable  Extremities:  BL LE: No pretibial edema normal ROM and strength.  Mental Status: Judgment, insight: Intact Memory: Intact for recent and remote events Mood and affect: No depression, anxiety, or agitation     DATA REVIEWED: Results for CRYSTALEE, VENTRESS (MRN Leatha Gilding) as of 02/03/2021 09:07  Ref. Range 02/02/2021 16:22  Preg, Serum Unknown POSITIVE (A)  TSH Latest Ref Range: 0.35 - 4.50 uIU/mL 0.45    Results for LOREDA, SILVERIO (MRN Leatha Gilding) as of 02/02/2021 13:03  Ref. Range 08/19/2020 14:33  Thyroperoxidase Ab SerPl-aCnc Latest Ref Range: 0 - 34 IU/mL 240 (H)     Thyroid ultrasound 07/2020 Markedly heterogeneous and potentially hyperemic thyroid without discrete nodule or mass. Findings are nonspecific though compatible with provided history of Hashimoto's thyroiditis  ASSESSMENT / PLAN / RECOMMENDATIONS:   1. Hashimoto's Thyroiditis:   - Pt with multiple non-specific symptoms . Give normal TSh these symptoms could not be attributed to her thyroid at this time  - NO local neck symptom , mainly throat issues  - She takes LT- 4 replacement  correctly.  - Pt with Positive HCG. TSH on  Lower end of normal , no change to her synthroid dose at this time . Will check in 4 weeks    - TSH goal during first trimester in 0.1- 2.5 uIU/mL    Medications  Continue Synthroid 75 mcg daily   F/U in 2 months  Labs in 4 weeks    Signed electronically by: 08/2020, MD  [facility]  [facility] [address] [place] [place], [place] Phone: [phone] FAX: [phone]      CC: [phone], MD  [address] [place] [address] Phone: [phone]  Fax: [phone]   Return to Endocrinology clinic as below: Future Appointments  Date Time Provider Department Center  02/02/2021  4:00 PM Kyce Ging, Konrad Dolores, MD [facility] None  02/15/2021  2:00 PM [facility] [place] 2 [facility]  02/22/2021  8:00 AM [facility] None  02/23/2021  3:00 PM [facility]  03/01/2021  9:00 AM Debbe Odea, MD [facility]  03/01/2021  3:40 PM Tressia Danas, MD [facility]  04/01/2021  2:15 PM [facility] LAB [facility] None  04/05/2021  1:00 PM Creig Hines, MD [facility] None

## 2021-02-03 ENCOUNTER — Other Ambulatory Visit: Payer: Self-pay | Admitting: *Deleted

## 2021-02-03 LAB — HCG, SERUM, QUALITATIVE: Preg, Serum: POSITIVE — AB

## 2021-02-03 MED ORDER — DOXYLAMINE-PYRIDOXINE 10-10 MG PO TBEC
2.0000 | DELAYED_RELEASE_TABLET | Freq: Every day | ORAL | 5 refills | Status: DC
Start: 2021-02-03 — End: 2021-03-23

## 2021-02-07 ENCOUNTER — Telehealth: Payer: Self-pay | Admitting: Nurse Practitioner

## 2021-02-07 ENCOUNTER — Other Ambulatory Visit: Payer: 59

## 2021-02-07 NOTE — Telephone Encounter (Signed)
Left message for Judith Martinez with below information from Dr. Patsy Lager.  I ask that she call the office to schedule an appointment to see Dr. Patsy Lager on Wednesday.

## 2021-02-07 NOTE — Telephone Encounter (Signed)
Dr. Orvan Falconer, pls see my chart msg patient sent me today. She is having nausea and vomiting. She has a positive beta HCG as ordered by her endocrinologist.  I advised the patient to call her OB ASAP to manage her nausea and vomiting.  I advised her to discontinue Amitiza as the safety of this medication during pregnancy is unclear, risk is most likely low but I asked her to stop it.  I advised her to take MiraLAX twice daily for constipation and to obtain further constipation medication recommendations from her OB.  Please let me know if there are any further recommendations you would advise at this time.

## 2021-02-07 NOTE — Telephone Encounter (Signed)
Can you call?  She has had so much going on it has been difficult for any of [place] to figure it out.  Let's get her to come into the office on Wed. To try to look at all the work-ups she has had with other MD's, too.

## 2021-02-07 NOTE — Telephone Encounter (Signed)
Thank you for the update - I agree with your recommendations.

## 2021-02-08 ENCOUNTER — Other Ambulatory Visit: Payer: Self-pay | Admitting: Nurse Practitioner

## 2021-02-08 MED ORDER — ONDANSETRON 8 MG PO TBDP: 8.0000 mg | ORAL_TABLET | Freq: Three times a day (TID) | ORAL | 0 refills | Status: DC | PRN

## 2021-02-09 ENCOUNTER — Encounter: Payer: Self-pay | Admitting: Family Medicine

## 2021-02-09 ENCOUNTER — Other Ambulatory Visit: Payer: 59

## 2021-02-09 ENCOUNTER — Other Ambulatory Visit: Payer: Self-pay

## 2021-02-09 ENCOUNTER — Ambulatory Visit (INDEPENDENT_AMBULATORY_CARE_PROVIDER_SITE_OTHER): Payer: 59 | Admitting: Family Medicine

## 2021-02-09 VITALS — BP 100/80 | HR 70 | Temp 98.5°F | Ht 67.0 in | Wt 162.8 lb

## 2021-02-09 DIAGNOSIS — R400 Somnolence: Secondary | ICD-10-CM | POA: Diagnosis not present

## 2021-02-09 DIAGNOSIS — R5383 Other fatigue: Secondary | ICD-10-CM

## 2021-02-09 DIAGNOSIS — M797 Fibromyalgia: Secondary | ICD-10-CM | POA: Diagnosis not present

## 2021-02-09 DIAGNOSIS — E063 Autoimmune thyroiditis: Secondary | ICD-10-CM

## 2021-02-09 DIAGNOSIS — K900 Celiac disease: Secondary | ICD-10-CM

## 2021-02-09 MED ORDER — DULOXETINE HCL 30 MG PO CPEP: 30.0000 mg | ORAL_CAPSULE | Freq: Every day | ORAL | 2 refills | Status: DC

## 2021-02-09 MED ORDER — TRAMADOL HCL 50 MG PO TABS: 50.0000 mg | ORAL_TABLET | Freq: Three times a day (TID) | ORAL | 0 refills | Status: DC | PRN

## 2021-02-09 NOTE — Progress Notes (Signed)
Judith Astarita T. Lenzie Montesano, MD, CAQ Sports Medicine  Primary Care and Sports Medicine [facility] [address] [place], [address]  Phone: [phone]  FAX: [phone]  Judith Martinez - 35 y.o. female  MRN [id number]  Date of Birth: [birth year]  Date: 02/09/2021  PCP: Hannah Beat, MD  Referral: Hannah Beat, MD  Chief Complaint  Patient presents with  . Follow-up    Medical Concerns    This visit occurred during the SARS-CoV-2 public health emergency.  Safety protocols were in place, including screening questions prior to the visit, additional usage of staff PPE, and extensive cleaning of exam room while observing appropriate contact time as indicated for disinfecting solutions.   Subjective:   Judith Martinez is a 35 y.o. very pleasant female patient who presents with the following:  She is here to follow-up about a number of medical concerns.  She currently just had a early miscarriage.  Oncology notes reviewed, and she does not have lupus anticoagulant /phospholipid antibodies.  I reviewed cardiology notes from last week, and they did cancel her nuclear test given that she was pregnant, and it appears that they have scheduled a exercise treadmill test.  I also reviewed her notes from Dr. Lonzo Cloud last week.  She has Hashimoto's, celiac disease, and fibromyalgia. She is now on a celiac diet.  Fatigue no better, and thyroid replacement has not helped. She is on Synthroid 75 mcg daily.  Her sleepiness and fatigue have continued to worsen over time.  She has current planing of some fatigue for some time, but now she is endorsing daytime sleepiness every day.  She falls asleep during the day.  She does generally sleep pretty well, and she gets about 7 hours of sleep at night.  She starts work about 5 AM, but she Finis is a 10 AM, and she subsequently has to take a nap.  She tells me that she falls asleep all day long.  She is not  taking any drugs or any supplements.  She has no history of stimulant use, cocaine or amphetamine use.  Fibromyalgia, she has seen rheumatology: She is on naltrexone and gabapentin.   Nausea and body feels very tired. Not eating, n/v.  Celiac tests - specialists - refractory celiac.  She is still having difficulty, and she is being diligent about following a gluten-free diet.  Sleeping.  Always tired.  Sleeping half of her day away.   5-10 AM. Will start falling asleep. Nauseous an headache.  Will fall asleep with driving. Will wake up choking and gasping for air.   9 PM - 3:30 AM.  Naps during the day. No sleep meds.   Does not alter with gabapentin.  This is not helped at all.  Sleep medicine consult.   Back feels stiff and will not   Has taken some tylenol No ibuprofen Gabapentin Heating pad Foam roller Nothing helps.  Cymbalta tramadol  Will have a hard time walking and moving.  Right arm will tingle all the time. Pain in her feet - will hurt and come into a muscle cramp. Not constant.   She does have some underlying anxiety, now she is not on any kind of antidepressants.  Review of Systems is noted in the HPI, as appropriate  Objective:   BP 100/80   Pulse 70   Temp 98.5 F (36.9 C) (Temporal)   Ht 5\' 7"  (1.702 m)   Wt 162 lb 12 oz (73.8 kg)  LMP 02/06/2021 Comment: five weeks pregnant  SpO2 100%   Breastfeeding No   BMI 25.49 kg/m   GEN: WDWN, NAD, Non-toxic HEENT: Atraumatic, Normocephalic. Neck supple. No masses. CV: RRR, No M/G/R. No JVD. No thrill. No extra heart sounds. EXTR: No c/c/e NEURO Normal gait.  PSYCH: Normally interactive. Conversant.   Laboratory and Imaging Data:  Assessment and Plan:     ICD-10-CM   1. Uncontrolled daytime somnolence  R40.0 Ambulatory referral to Pulmonology  2. Fatigue, unspecified type  R53.83 Ambulatory referral to Pulmonology  3. Fibromyalgia  M79.7   4. Hashimoto's thyroiditis  E06.3   5.  Celiac disease  K90.0    Total encounter time: 50 minutes. This includes total time spent on the day of encounter.  I spent approximately 10 to 15 minutes in chart review prior to the patient coming in the office on the day of the encounter, and an additional 40-minute face-to-face encounter in her 40-minute appointment slot.  We tried to go over a number of different consultations, and we reviewed different organ systems that have been involved as well as other tests.  She has very thorough work-ups in multiple systems including GI, rheumatological, endocrinological, hematological, OB, and complicated OB care..  And she has an upcoming cardiac work-up.  She has been having some escalating somnolence and falling asleep during the daytime and tired all the time.  She has never had a sleep study, and she certainly could have sleep apnea.  Narcolepsy would be on the differential as well.  In regards to her fibromyalgia, I did review this was with her as well.  And I think that it would probably help her to start some Cymbalta from [place] fibromyalgia and anxiety standpoint.  I am also going to give her some tramadol from a fibromyalgia standpoint.  Meds ordered this encounter  Medications  . DULoxetine (CYMBALTA) 30 MG capsule    Sig: Take 1 capsule (30 mg total) by mouth daily.    Dispense:  30 capsule    Refill:  2  . traMADol (ULTRAM) 50 MG tablet    Sig: Take 1 tablet (50 mg total) by mouth every 8 (eight) hours as needed for moderate pain.    Dispense:  20 tablet    Refill:  0   Medications Discontinued During This Encounter  Medication Reason  . dicyclomine (BENTYL) 10 MG capsule Completed Course  . hydrocortisone (ANUSOL-HC) 25 MG suppository Completed Course  . hydrOXYzine (ATARAX/VISTARIL) 50 MG tablet Completed Course  . omeprazole (PRILOSEC) 40 MG capsule Completed Course  . venlafaxine XR (EFFEXOR-XR) 75 MG 24 hr capsule Completed Course   Orders Placed This Encounter  Procedures   . HM PAP SMEAR  . Ambulatory referral to Pulmonology    Follow-up: Return in about 6 weeks (around 03/23/2021).  Signed,  Elpidio Galea. Khali Perella, MD   Outpatient Encounter Medications as of 02/09/2021  Medication Sig  . Doxylamine-Pyridoxine (DICLEGIS) 10-10 MG TBEC Take 2 tablets by mouth at bedtime. If symptoms persist, add one tablet in the morning and one in the afternoon  . DULoxetine (CYMBALTA) 30 MG capsule Take 1 capsule (30 mg total) by mouth daily.  Marland Kitchen gabapentin (NEURONTIN) 300 MG capsule Take 300 mg by mouth 3 (three) times daily.  Marland Kitchen lubiprostone (AMITIZA) 24 MCG capsule Take 1 capsule (24 mcg total) by mouth 2 (two) times daily with a meal.  . ondansetron (ZOFRAN ODT) 8 MG disintegrating tablet Take 1 tablet (8 mg total) by mouth every 8 (  eight) hours as needed for nausea or vomiting.  Marland Kitchen SYNTHROID 75 MCG tablet Take 1 tablet (75 mcg total) by mouth daily before breakfast.  . traMADol (ULTRAM) 50 MG tablet Take 1 tablet (50 mg total) by mouth every 8 (eight) hours as needed for moderate pain.  . [DISCONTINUED] dicyclomine (BENTYL) 10 MG capsule Take 1 capsule (10 mg total) by mouth 4 (four) times daily as needed for spasms. (Patient not taking: Reported on 02/02/2021)  . [DISCONTINUED] hydrocortisone (ANUSOL-HC) 25 MG suppository Place 1 suppository (25 mg total) rectally at bedtime as needed for hemorrhoids or anal itching. (Patient not taking: Reported on 01/31/2021)  . [DISCONTINUED] hydrOXYzine (ATARAX/VISTARIL) 50 MG tablet Take 50 mg by mouth 2 (two) times daily. (Patient not taking: Reported on 01/31/2021)  . [DISCONTINUED] omeprazole (PRILOSEC) 40 MG capsule Take 1 capsule (40 mg total) by mouth daily. (Patient not taking: Reported on 01/31/2021)  . [DISCONTINUED] venlafaxine XR (EFFEXOR-XR) 75 MG 24 hr capsule Take 75 mg by mouth daily. (Patient not taking: Reported on 02/02/2021)   No facility-administered encounter medications on file as of 02/09/2021.

## 2021-02-10 ENCOUNTER — Encounter: Payer: Self-pay | Admitting: Family Medicine

## 2021-02-10 DIAGNOSIS — K900 Celiac disease: Secondary | ICD-10-CM

## 2021-02-10 DIAGNOSIS — Z92241 Personal history of systemic steroid therapy: Secondary | ICD-10-CM | POA: Insufficient documentation

## 2021-02-10 DIAGNOSIS — E063 Autoimmune thyroiditis: Secondary | ICD-10-CM | POA: Insufficient documentation

## 2021-02-10 HISTORY — DX: Autoimmune thyroiditis: E06.3

## 2021-02-10 HISTORY — DX: Celiac disease: K90.0

## 2021-02-10 NOTE — Progress Notes (Signed)
02/11/21- 34 yoF never smoker  for sleep evaluation courtesy of Dr Kerin Perna with concern of daytime somnolence Patient is not pregnant- miscarried last week  Medical problem list includes Migraine, GERD, Hypothyroid/ hashimoto's, Anxiety, Panic Attacks, Fibromyalgia, Celiac Disease, Epworth Score-22  Body weight today - 162 lbs Covid vax-none Flu vax-noneMeds include naltrexone (for ETOH or opioid dependence, may cause insomnia), gabapentin, Cymbalta for fibromyalgia, tramadol for fibromyalgia. Recently discontinued Atarax, Effexor which she says she never took.. HS 8:30-9:30, up 4:35 AM for work, through by [employer]. 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 Denies any use of ETOH or opioids. Says naltrexone was prescribed to help with inflammation of fibromyalgia, but not taking it. Describes gradually progressive daytime sleepiness over past 2-3 years. Told by son that she snores loudly- not witnessed apnea. Daytime caffeine not much help- 1 cup coffee. Sometimes wakes with gasp and choke. Avoids driving more than 1/2 hour- longer she gets too sleepy. Sitting helping son w school work she can only last an hour at a time. Napping 2-3 hours/ day- doesn't feel better on waking. Short sleep latency. When she gets very tired it nauseates her.  Some vivid dreams, but tends not to remember. May have some dream intrusion while awake that may be hypnagogic hallucination. Sleep paralysis on waking 2-3x/ month. No cataplexy. Describes episodes when stressed when her body will  "burn and I curl up". ENT surgery + tonsils. Pending cardiac w/u for high cholesterol  Prior to Admission medications   Medication Sig Start Date End Date Taking? Authorizing Provider  Doxylamine-Pyridoxine (DICLEGIS) 10-10 MG TBEC Take 2 tablets by mouth at bedtime. If symptoms persist, add one tablet in the morning and one in the afternoon 02/03/21  Yes Reva Bores, MD  DULoxetine (CYMBALTA) 30 MG capsule Take 1 capsule (30 mg total) by  mouth daily. 02/09/21  Yes Copland, Karleen Hampshire, MD  gabapentin (NEURONTIN) 300 MG capsule Take 300 mg by mouth 3 (three) times daily. 01/28/21  Yes [provider]  lubiprostone (AMITIZA) 24 MCG capsule Take 1 capsule (24 mcg total) by mouth 2 (two) times daily with a meal. 01/27/21  Yes Arnaldo Natal, NP  ondansetron (ZOFRAN ODT) 8 MG disintegrating tablet Take 1 tablet (8 mg total) by mouth every 8 (eight) hours as needed for nausea or vomiting. 02/08/21  Yes Arnaldo Natal, NP  SYNTHROID 75 MCG tablet Take 1 tablet (75 mcg total) by mouth daily before breakfast. 08/09/20  Yes Romero Belling, MD  traMADol (ULTRAM) 50 MG tablet Take 1 tablet (50 mg total) by mouth every 8 (eight) hours as needed for moderate pain. 02/09/21  Yes Copland, Karleen Hampshire, MD   Past Medical History:  Diagnosis Date  . Celiac disease 02/10/2021  . Fibromyalgia   . GERD (gastroesophageal reflux disease)   . Hashimoto's thyroiditis 02/10/2021  . History of kidney stones 2013  . History of pyelonephritis 2012  . Hx of migraines   . Panic attacks 07/30/2018   Past Surgical History:  Procedure Laterality Date  . BREAST ENHANCEMENT SURGERY  2012  . BREAST IMPLANT REMOVAL  2021  . TONSILLECTOMY  1994  . TUBAL LIGATION  10/13  . tubal reversal  2016   ROS-see HPI   + = positive Constitutional:    +weight loss, night sweats, fevers, chills, fatigue, lassitude. HEENT:    +headaches, difficulty swallowing, tooth/dental problems, +sore throat,       sneezing, itching, ear ache, nasal congestion, post nasal drip, snoring CV:    +chest pain,  orthopnea, PND, swelling in lower extremities, anasarca,                                  dizziness, palpitations Resp:   +shortness of breath with exertion or at rest.                productive cough,   non-productive cough, coughing up of blood.              change in color of mucus.  wheezing.   Skin:    rash or lesions. GI:  + heartburn, +indigestion, +abdominal  pain, nausea, vomiting, diarrhea,                 change in bowel habits, loss of appetite GU: dysuria, change in color of urine, no urgency or frequency.   flank pain. MS:   +joint pain, stiffness, decreased range of motion, back pain. Neuro-     nothing unusual Psych:  change in mood or affect.  depression or anxiety.   memory loss.  OBJ- Physical Exam General- Alert, Oriented, Affect-appropriate, Distress- none acute, +medium build Skin- rash-none, lesions- none, excoriation- none, + tatoos Lymphadenopathy- none Head- atraumatic            Eyes- Gross vision intact, PERRLA, conjunctivae and secretions clear            Ears- Hearing, canals-normal            Nose- Clear, no-Septal dev, mucus, polyps, erosion, perforation, +ring             Throat- Mallampati II , mucosa clear , drainage- none, tonsils- absent,  +teeth Neck- flexible , trachea midline, no stridor , thyroid nl, carotid no bruit Chest - symmetrical excursion , unlabored           Heart/CV- RRR , no murmur , no gallop  , no rub, nl s1 s2                           - JVD- none , edema- none, stasis changes- none, varices- none           Lung- clear to P&A, wheeze- none, cough- none , dullness-none, rub- none           Chest wall-  Abd-  Br/ Gen/ Rectal- Not done, not indicated Extrem- cyanosis- none, clubbing, none, atrophy- none, strength- nl Neuro- grossly intact to observation

## 2021-02-11 ENCOUNTER — Other Ambulatory Visit: Payer: Self-pay

## 2021-02-11 ENCOUNTER — Ambulatory Visit (INDEPENDENT_AMBULATORY_CARE_PROVIDER_SITE_OTHER): Payer: 59 | Admitting: Internal Medicine

## 2021-02-11 ENCOUNTER — Encounter: Payer: Self-pay | Admitting: Internal Medicine

## 2021-02-11 ENCOUNTER — Ambulatory Visit: Payer: 59 | Admitting: Internal Medicine

## 2021-02-11 VITALS — BP 120/80 | HR 68 | Temp 97.4°F | Ht 65.0 in | Wt 162.6 lb

## 2021-02-11 DIAGNOSIS — G4710 Hypersomnia, unspecified: Secondary | ICD-10-CM | POA: Insufficient documentation

## 2021-02-11 DIAGNOSIS — E063 Autoimmune thyroiditis: Secondary | ICD-10-CM | POA: Diagnosis not present

## 2021-02-11 DIAGNOSIS — K219 Gastro-esophageal reflux disease without esophagitis: Secondary | ICD-10-CM | POA: Diagnosis not present

## 2021-02-11 NOTE — Telephone Encounter (Signed)
The patient is currently scheduled for a GXT on 02/23/21.  Her attestation order is pending sign off by Dr. Azucena Cecil.  To MD/ Primary nurse as an Lorain Childes.

## 2021-02-11 NOTE — Assessment & Plan Note (Signed)
Denies RAI treatment

## 2021-02-11 NOTE — Patient Instructions (Addendum)
Order- schedule NPSG followed next day by MSLT at sleep center    Dx hypersomnia  Bring sleep diary with you to sleep center when you come. Try to minimize or avoid napping on day of your sleep study.  Please try not to take Diclegis, Cymbalta,  Gabapentin or tramadol for the week leading up to your sleep study  Please call us for results and recommendations about 2 weeks after your study

## 2021-02-11 NOTE — Assessment & Plan Note (Signed)
She is aware of occasional reflux. Not clear if this contributes to "gasp and choke" while lying down.  May need more attention.

## 2021-02-11 NOTE — Assessment & Plan Note (Signed)
Some hx of snoring and gasping in sleep, raising possibility of OSA.  Her description of hypersomnia with some sleep paralysis needs to be explored as possible narcolepsy without cataplexy. Medically complex history. We discussed these elements. She feels she can be off stimulant/ sedating meds including her Cymbalta for long enough to give valid NPSG and MSLT. Hopefully this won't cause her Celiac disease to flare- possibility discussed. Plan- sleep diary, NPSG/MSLT

## 2021-02-15 ENCOUNTER — Other Ambulatory Visit: Payer: Self-pay

## 2021-02-15 ENCOUNTER — Ambulatory Visit (INDEPENDENT_AMBULATORY_CARE_PROVIDER_SITE_OTHER): Payer: 59

## 2021-02-15 DIAGNOSIS — R0600 Dyspnea, unspecified: Secondary | ICD-10-CM

## 2021-02-15 DIAGNOSIS — R072 Precordial pain: Secondary | ICD-10-CM | POA: Diagnosis not present

## 2021-02-15 DIAGNOSIS — R0609 Other forms of dyspnea: Secondary | ICD-10-CM

## 2021-02-15 LAB — ECHOCARDIOGRAM COMPLETE
AR max vel: 2.51 cm2
AV Area VTI: 2.33 cm2
AV Area mean vel: 2.25 cm2
AV Mean grad: 4 mmHg
AV Peak grad: 6.7 mmHg
Ao pk vel: 1.29 m/s
Area-P 1/2: 4.65 cm2
S' Lateral: 3.2 cm

## 2021-02-17 NOTE — Telephone Encounter (Signed)
Judith Martinez, can you pls follow up on referral to [facility] for celiac dz specialist as requested by Dr. Orvan Falconer.   Pls let pt know any update. Instruct her to take Magnesium Citrate 5 to 10 ounces diluted in 8 ounces of any clear liquid of her choice to empty out her bowel which will hopefully provide her with relief. Thx

## 2021-02-18 ENCOUNTER — Other Ambulatory Visit: Payer: Self-pay | Admitting: Family Medicine

## 2021-02-18 ENCOUNTER — Telehealth: Payer: Self-pay

## 2021-02-18 ENCOUNTER — Other Ambulatory Visit: Payer: Self-pay | Admitting: Cardiology

## 2021-02-18 DIAGNOSIS — R072 Precordial pain: Secondary | ICD-10-CM

## 2021-02-18 NOTE — Telephone Encounter (Signed)
Urgent referral faxed to [facility] in [place] at [phone]. I spoke with Vickey Huger and she states that they do have celiac specialist. She states that they would need the referral before patient can be scheduled. I have faxed urgent referral, records, demographic and insurance information.   Phone # [phone].

## 2021-02-18 NOTE — Telephone Encounter (Signed)
Last office visit 02/09/2021 for multiple issues.  Last refilled 02/09/2021 for #20 with no refills.  Next Appt: 03/28/2021 for 6 week follow up.

## 2021-02-19 MED ORDER — TRAMADOL HCL 50 MG PO TABS: 50.0000 mg | ORAL_TABLET | Freq: Three times a day (TID) | ORAL | 1 refills | Status: DC | PRN

## 2021-02-21 NOTE — Telephone Encounter (Signed)
Dr. Azucena Cecil signed on 02/18/21.

## 2021-02-21 NOTE — Telephone Encounter (Signed)
Sent patient a MyChart offering to switch her ETT back to a Myoview as patient had notified me that she is no longer pregnant. Patient did not have VM available so I sent a MyChart as her ETT is scheduled for 02/23/21.

## 2021-02-22 ENCOUNTER — Other Ambulatory Visit: Admission: RE | Admit: 2021-02-22 | Payer: 59 | Source: Ambulatory Visit

## 2021-02-22 ENCOUNTER — Telehealth: Payer: Self-pay | Admitting: *Deleted

## 2021-02-22 NOTE — Telephone Encounter (Signed)
Called pt to confirm ETT that is scheduled tomorrow 02/23/21 at 3:00 PM.  Pt did confirm that she chooses to proceed with ETT at this time.  Pt voices that she will complete Covid test today before 2PM at designated site in [place].  Reviewed procedure instructions with pt and she voices understanding and has no questions.   Stress test attestation form ordered and will forward to MD for signature.

## 2021-02-22 NOTE — Telephone Encounter (Signed)
Called patient and she decided to cancel the ETT and go ahead with the Myoview since she is not pregnant any more. Myoview scheduled for 02/25/21

## 2021-02-22 NOTE — Addendum Note (Signed)
Addended by: Gibson Ramp on: 02/22/2021 11:48 AM   Modules accepted: Orders

## 2021-02-22 NOTE — Telephone Encounter (Signed)
See telephone encounter 02/01/21.  Called and confirmed pt's ETT procedure 02/23/21.

## 2021-02-22 NOTE — Addendum Note (Signed)
Addended by: Lanny Hurst on: 02/22/2021 11:28 AM   Modules accepted: Orders

## 2021-02-23 NOTE — Telephone Encounter (Signed)
Spoke with Lana at [facility] to see if patient had been scheduled, she states that they have been short staffed and have not gotten to all referrals but she will reach out to patient now to schedule. Patient has been notified via My Chart.

## 2021-02-25 ENCOUNTER — Encounter
Admission: RE | Admit: 2021-02-25 | Discharge: 2021-02-25 | Disposition: A | Discharge status: Home / Self Care | Payer: 59 | Source: Ambulatory Visit | Attending: Cardiology | Admitting: Cardiology

## 2021-02-25 ENCOUNTER — Other Ambulatory Visit: Payer: Self-pay

## 2021-02-25 DIAGNOSIS — R072 Precordial pain: Secondary | ICD-10-CM | POA: Diagnosis not present

## 2021-02-25 DIAGNOSIS — R0600 Dyspnea, unspecified: Secondary | ICD-10-CM | POA: Diagnosis not present

## 2021-02-25 DIAGNOSIS — R0609 Other forms of dyspnea: Secondary | ICD-10-CM

## 2021-02-25 LAB — NM MYOCAR MULTI W/SPECT W/WALL MOTION / EF
Estimated workload: 1 METS
Exercise duration (min): 0 min
Exercise duration (sec): 0 s
LV dias vol: 143 mL (ref 46–106)
LV sys vol: 61 mL
MPHR: 187 {beats}/min
Peak HR: 116 {beats}/min
Percent HR: 62 %
Rest HR: 51 {beats}/min
TID: 1.05

## 2021-02-25 MED ORDER — TECHNETIUM TC 99M TETROFOSMIN IV KIT
30.3190 | PACK | Freq: Once | INTRAVENOUS | Status: AC | PRN
  Administered 2021-02-25: 30.319 via INTRAVENOUS

## 2021-02-25 MED ORDER — REGADENOSON 0.4 MG/5ML IV SOLN
0.4000 mg | Freq: Once | INTRAVENOUS | Status: AC
  Administered 2021-02-25: 0.4 mg via INTRAVENOUS

## 2021-02-25 MED ORDER — TECHNETIUM TC 99M TETROFOSMIN IV KIT
10.2810 | PACK | Freq: Once | INTRAVENOUS | Status: AC | PRN
  Administered 2021-02-25: 10.281 via INTRAVENOUS

## 2021-03-01 ENCOUNTER — Ambulatory Visit: Payer: 59 | Admitting: Cardiology

## 2021-03-01 ENCOUNTER — Ambulatory Visit: Payer: 59 | Admitting: Gastroenterology

## 2021-03-03 ENCOUNTER — Other Ambulatory Visit: Payer: Self-pay | Admitting: Family Medicine

## 2021-03-03 ENCOUNTER — Encounter: Payer: Self-pay | Admitting: Internal Medicine

## 2021-03-04 ENCOUNTER — Telehealth: Payer: Self-pay | Admitting: Internal Medicine

## 2021-03-04 NOTE — Telephone Encounter (Signed)
Judith Martinez,   Can you please call her . I am not sure what's going on and I can;t keep going back and forth.   She needs to take JUSt half a tablet of synthroid on SUNDAYS and ONE full tablet Monday, Tuesday, Wednesday, Thursday , Friday and Saturday.    She can keep trying to get pregnant , she needs to see an infertility doctor.   Thanks Konrad Dolores Ona Rathert

## 2021-03-04 NOTE — Telephone Encounter (Signed)
Tried to call patient but could not leave VM since full 

## 2021-03-07 ENCOUNTER — Encounter: Payer: Self-pay | Admitting: Cardiology

## 2021-03-07 ENCOUNTER — Ambulatory Visit (INDEPENDENT_AMBULATORY_CARE_PROVIDER_SITE_OTHER): Payer: 59 | Admitting: Cardiology

## 2021-03-07 ENCOUNTER — Other Ambulatory Visit: Payer: Self-pay

## 2021-03-07 VITALS — BP 110/60 | HR 81 | Ht 66.0 in | Wt 163.0 lb

## 2021-03-07 DIAGNOSIS — R072 Precordial pain: Secondary | ICD-10-CM | POA: Diagnosis not present

## 2021-03-07 DIAGNOSIS — R42 Dizziness and giddiness: Secondary | ICD-10-CM | POA: Diagnosis not present

## 2021-03-07 DIAGNOSIS — E7800 Pure hypercholesterolemia, unspecified: Secondary | ICD-10-CM | POA: Diagnosis not present

## 2021-03-07 NOTE — Patient Instructions (Signed)
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
## 2021-03-07 NOTE — Telephone Encounter (Signed)
Tried to call patient but could not leave VM since full

## 2021-03-08 DIAGNOSIS — S161XXA Strain of muscle, fascia and tendon at neck level, initial encounter: Secondary | ICD-10-CM | POA: Diagnosis not present

## 2021-03-08 DIAGNOSIS — R202 Paresthesia of skin: Secondary | ICD-10-CM | POA: Diagnosis not present

## 2021-03-08 DIAGNOSIS — M797 Fibromyalgia: Secondary | ICD-10-CM | POA: Diagnosis not present

## 2021-03-08 NOTE — Telephone Encounter (Signed)
Called [facility] to f/u on status of referral. Spoke with Corrie Dandy whom states the pt was called x1, VM left and pt has not yet returned call. Corrie Dandy states she is going to call the pt again to attempt to schedule appt.

## 2021-03-08 NOTE — Telephone Encounter (Signed)
Spoken to patient and notified Dr Shamleffer's comments. Verbalized understanding.   

## 2021-03-10 ENCOUNTER — Encounter: Payer: Self-pay | Admitting: Family Medicine

## 2021-03-10 ENCOUNTER — Encounter: Payer: 59 | Admitting: Family Medicine

## 2021-03-10 NOTE — Telephone Encounter (Signed)
This is reasonable.    I cannot order MRI's without knowing which body part is involved and a thorough history and exam.    SNRI's, TCA's, gabapentin, and tramadol are first line treatment for fibromyalgia.  Opioids are not prefered for myofascial pain syndromes. Rheumatology has also followed this algorithm.   Recent UpToDate reviews by myself and conference attendence support this.  Escalating opioid analgesics is not undertaken lightly with back pain and neck pain. A 2017 MRI of the L-spine was entirely unremarkable.  I believe that my treatment patterns in this case follow current evidence based medicine.

## 2021-03-10 NOTE — Telephone Encounter (Signed)
This additional note was ordered in error.

## 2021-03-10 NOTE — Progress Notes (Signed)
Patient did not keep appointment today. She may call to reschedule.  

## 2021-03-11 ENCOUNTER — Encounter: Payer: Self-pay | Admitting: Gastroenterology

## 2021-03-11 NOTE — Telephone Encounter (Signed)
Called [facility] and spoke with [facility] Again states they called pt to schedule appt but pt VM is full. States they are happy to schedule appt if pt could give them a call. Advised I will send pt a message to make her aware. Expressed appreciation. Letter attached below and sent via My Chart:  [facility] [address] [place], [place]  [address] Phone:  [phone]   Fax:  [phone]   MRN: [id number] MADISIN HASAN [address] [place] [address]   Date: 03/11/2021  Dear Ms. Ernest Mallick,  We have received notification from [facility] regarding their unsuccessful attempts to reach you about scheduling your new patient appointment. It seems your voicemail has been full and they have been unable to leave a voice message with their contact information. We are writing you to inform you of their contact number of [phone]. Please call their office at your earliest convenience to schedule your appointment.    Sincerely,  Tressia Danas, MD

## 2021-03-14 ENCOUNTER — Ambulatory Visit: Payer: 59 | Admitting: Cardiology

## 2021-03-23 ENCOUNTER — Ambulatory Visit (INDEPENDENT_AMBULATORY_CARE_PROVIDER_SITE_OTHER): Payer: 59

## 2021-03-23 ENCOUNTER — Encounter: Payer: Self-pay | Admitting: Cardiology

## 2021-03-23 ENCOUNTER — Ambulatory Visit (INDEPENDENT_AMBULATORY_CARE_PROVIDER_SITE_OTHER): Payer: 59 | Admitting: Cardiology

## 2021-03-23 ENCOUNTER — Encounter (HOSPITAL_BASED_OUTPATIENT_CLINIC_OR_DEPARTMENT_OTHER): Payer: Self-pay

## 2021-03-23 ENCOUNTER — Other Ambulatory Visit: Payer: Self-pay

## 2021-03-23 VITALS — BP 127/76 | HR 77 | Ht 65.0 in | Wt 156.0 lb

## 2021-03-23 DIAGNOSIS — R072 Precordial pain: Secondary | ICD-10-CM

## 2021-03-23 DIAGNOSIS — R42 Dizziness and giddiness: Secondary | ICD-10-CM
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
## 2021-03-23 NOTE — Patient Instructions (Addendum)
Medication Instructions:  Your physician recommends that you continue on your current medications as directed. Please refer to the Current Medication list given to you today. *If you need a refill on your cardiac medications before your next appointment, please call your pharmacy*  Lab Work: None ordered. If you have labs (blood work) drawn today and your tests are completely normal, you will receive your results only by: Marland Kitchen MyChart Message (if you have MyChart) OR . A paper copy in the mail If you have any lab test that is abnormal or we need to change your treatment, we will call you to review the results.  Testing/Procedures: Your physician has recommended that you wear a holter monitor. Holter monitors are medical devices that record the heart's electrical activity. Doctors most often use these monitors to diagnose arrhythmias. Arrhythmias are problems with the speed or rhythm of the heartbeat. The monitor is a small, portable device. You can wear one while you do your normal daily activities. This is usually used to diagnose what is causing palpitations/syncope (passing out).  You will wear a 7 day ZIO monitor  Follow-Up: At [facility], you and your health needs are our priority.  As part of our continuing mission to provide you with exceptional heart care, we have created designated Provider Care Teams.  These Care Teams include your primary Cardiologist (physician) and Advanced Practice Providers (APPs -  Physician Assistants and Nurse Practitioners) who all work together to provide you with the care you need, when you need it.  Your next appointment:   Your physician wants you to follow-up in: based on results of heart monitor with Dr. Lalla Brothers.     Your physician has recommended that you wear a Zio monitor.   This monitor is a medical device that records the heart's electrical activity. Doctors most often use these monitors to diagnose arrhythmias. Arrhythmias are problems with the  speed or rhythm of the heartbeat. The monitor is a small device applied to your chest. You can wear one while you do your normal daily activities. While wearing this monitor if you have any symptoms to push the button and record what you felt. Once you have worn this monitor for the period of time provider prescribed (Usually 14 days), you will return the monitor device in the postage paid box. Once it is returned they will download the data collected and provide [place] with a report which the provider will then review and we will call you with those results. Important tips:  1. Avoid showering during the first 24 hours of wearing the monitor. 2. Avoid excessive sweating to help maximize wear time. 3. Do not submerge the device, no hot tubs, and no swimming pools. 4. Keep any lotions or oils away from the patch. 5. After 24 hours you may shower with the patch on. Take brief showers with your back facing the shower head.  6. Do not remove patch once it has been placed because that will interrupt data and decrease adhesive wear time. 7. Push the button when you have any symptoms and write down what you were feeling. 8. Once you have completed wearing your monitor, remove and place into box which has postage paid and place in your outgoing mailbox.  9. If for some reason you have misplaced your box then call our office and we can provide another box and/or mail it off for you.

## 2021-03-24 ENCOUNTER — Telehealth: Payer: Self-pay

## 2021-03-24 DIAGNOSIS — G4710 Hypersomnia, unspecified: Secondary | ICD-10-CM

## 2021-03-24 NOTE — Telephone Encounter (Signed)
Dr. Maple Hudson please advise if you are ok with me ordering HST?   Ottinger, Clementeen Graham, MD; Gaylyn Cheers, CMA [facility] has denied NPSG/MSLT THEY said there was no HST done 1st

## 2021-03-24 NOTE — Addendum Note (Signed)
Addended by: Thayer Headings, Milledge Gerding L on: 03/24/2021 03:21 PM   Modules accepted: Orders

## 2021-03-24 NOTE — Telephone Encounter (Signed)
-----   Message from Tobe Sos sent at 03/24/2021  4:17 PM EDT ----- Regarding: sleep study/mslt [facility] has denied NPSG/MSLT THEY said there was no HST done 1st

## 2021-03-25 NOTE — Telephone Encounter (Signed)
I think we will have to order HST instead of the NPSG/ MSLT. We will come back to those later if necessary.

## 2021-03-25 NOTE — Telephone Encounter (Signed)
HST order has been placed.  Also called and let patient know that insurance was requiring this to be done. She expressed understanding. Tried to discontinue orders but they have already been scheduled so will route to [facility] for her to cancel.

## 2021-03-28 ENCOUNTER — Ambulatory Visit: Payer: 59 | Admitting: Family Medicine

## 2021-03-28 NOTE — Progress Notes (Addendum)
Spoke with patient on 03/24/21 via phone and while she did express her wish to transfer care from Dr. Patsy Lager, she does not want to leave our [facility] office.  We will work on taking care of her acute needs until she can transfer to our new provider Audria Nine, NP who is coming in July.  Patient was appreciative of our support in continuing her care and is okay with establishing with [facility] in July.  She will see Dr. Para March on 5/17 to address her current acute medical needs.

## 2021-03-28 NOTE — Telephone Encounter (Signed)
Patient left message on my voice mail to discuss some concerns regarding her care.  I returned patients call.  She informed me of her care concerns with provider Dr. Patsy Lager.  Patient states that she has been seeing him for quite some time and feels like he has easily dismissed her concerns and not paid close attention to her symptoms.  She states she has complex medical conditions with her Hashimoto's thyroiditis, fibromyalgia and POTS issues and that, at times, she has been made to feel that she is "making stuff up" or might be drug seeking.  She also thinks he may have missed some symptoms early on that could have prevented ongoing distress.  Currently, she feels there is a disconnect between them regarding evaluation and management of her current medication regimen and neck/shoulder pain issues.      Patient remained very calm and pleasant throughout the conversation.  She does not want to leave our office but would like to transfer her care to another PCP.  Firstly, I did apologize for her perceived negative experience and thanked her for the feedback.  I did let her know that I would share this feedback with her provider but I did want her to know that I have known Dr. Patsy Lager for many years and know that he would never want any patient to perceive him in that manner.  He is an excellent physician and I know will want to be aware of how she felt as I am sure this was never his intention.  Patient thanked me for that feedback.    I did inform patient that we currently do not have any providers taking new patients and will put her on the list to be contacted once we open our new NP Matt Cable's schedule up for July.  In the meantime, I have placed her on Dr. Lianne Bushy schedule for 03/29/21 to address her acute pain and med follow up issues in the interim and have spoken with Dr. Para March.  Patient feels that this is a good plan for her and will move forward with transfer of care in July once that is set up.  She  knows we will meet her acute care needs in the interim until she transfers to Audria Nine, NP.

## 2021-03-29 ENCOUNTER — Encounter: Payer: Self-pay | Admitting: Family Medicine

## 2021-03-29 ENCOUNTER — Ambulatory Visit (INDEPENDENT_AMBULATORY_CARE_PROVIDER_SITE_OTHER): Payer: 59 | Admitting: Family Medicine

## 2021-03-29 ENCOUNTER — Other Ambulatory Visit: Payer: Self-pay

## 2021-03-29 DIAGNOSIS — E063 Autoimmune thyroiditis: Secondary | ICD-10-CM

## 2021-03-29 DIAGNOSIS — M797 Fibromyalgia: Secondary | ICD-10-CM

## 2021-03-29 DIAGNOSIS — G4710 Hypersomnia, unspecified: Secondary | ICD-10-CM

## 2021-03-29 DIAGNOSIS — K900 Celiac disease: Secondary | ICD-10-CM

## 2021-03-29 NOTE — Telephone Encounter (Signed)
Spoke to Kia[facility] sleep center she has cancelled both npsg&mslt Tobe Sos

## 2021-03-29 NOTE — Patient Instructions (Addendum)
On the way out, please sign a record release for your notes from [facility].  Let me review your notes.  I wouldn't change your meds for now.   Take care.

## 2021-03-29 NOTE — Progress Notes (Addendum)
This visit occurred during the SARS-CoV-2 public health emergency.  Safety protocols were in place, including screening questions prior to the visit, additional usage of staff PPE, and extensive cleaning of exam room while observing appropriate contact time as indicated for disinfecting solutions.  I met the patient for the first time today.  She had a 1200 appointment.  When staff went to retrieve the patient from the waiting room at 1208 after she had been checked in, she had already left.  We called the patient, she eventually returned to the clinic and we worked her back into the schedule later that day. She never mentioned to me the reason for leaving the clinic or acknowledged that she had walked out of the waiting room.  Her plan is to establish with a new PCP here at the clinic.  There is another provider who will be starting in the near future.  I am not currently accepting new patients.  I have been asked to see her in the meantime.  The patient is aware that she has an extensive medical record and given the timeline of her appointment from scheduling to today, there was no way for me to review her entire medical record prior to the visit.  She understands that.  She was seen at [facility] approximately 1 month ago.  I do not yet have those records to review.  I asked her to sign a record release for those today.  She was seen there reportedly due to right posterior shoulder pain with tingling in the right arm and right leg.  This has been happening recurrently, up to 2 to 3 days at a time.  This is been going on overall intermittently for about 6 weeks.  She had not noticed a foot drop or weakness.  There was no left-sided tingling.  She has occasional left-sided lower leg pain, on the lateral shin, but this was not similar to her right-sided symptoms.  She does not have left arm symptoms.  She has a separate issue of diffuse joint pain/trunk pain that predates the above.  She has seen  rheumatology previously.  She has not noted a change in her pain level with Cymbalta or gabapentin use.  She reports compliance with those.  She had 5 kids prev with mult miscarriages in the meantime.  She had recent miscarriage, condolences offered.  She started having miscarriages over the past few years.    She is seen multiple other doctors with Dr. Lorelei Pont as her prior PCP, Dr. Quentin Ore with cardiology/electrophysiology, Dr. Posey Pronto with rheumatology, Dr. Annamaria Boots with pulmonary, Dr. Kelton Pillar with endocrinology.  She has been diagnosed with hypothyroidism/Hashimoto's thyroiditis and fibromyalgia.  Meds, vitals, and allergies reviewed.   ROS: Per HPI unless specifically indicated in ROS section   GEN: nad, alert and oriented, multiple tattoos noted.  Cardiac monitor in place on the left upper chest wall. HEENT: ncat NECK: supple w/o LA, neck is not stiff.  Normal range of motion with chin to chest, neck extension and rotation. CV: rrr. PULM: ctab, no inc wob ABD: soft, +bs EXT: no edema SKIN: no acute rash, well perfused. CN 2-12 wnl B, S/S/DTR wnl x4 B paraspinal muscles ttp but not ttp in midline.  Normal range of motion of the shoulders.  Normal grip.  No foot drop.   =========================  Record review done.  Multiple subspecialty notes reviewed.  This is completely separate from all time related to office visit and documentation of office visit.  1.  Right  arm and leg tingling discussed at office visit, will await orthopedic notes.  She may end up needing additional neck imaging, but I need to review those notes first.  If her neck imaging is unremarkable then she may end up needing neurology referral.  2.    Dizziness.  Cardiology evaluation ongoing with ambulatory monitor.  I will defer to cardiology about that.  3.    History of snoring.  Pulmonary notes state plan for sleep evaluation.  I do not see sleep test results yet.  I think it makes sense for her to contact  pulmonary if she has not yet completed that testing.  4.  Hashimoto's thyroiditis. Endocrine notes reviewed.  The plan from endocrine makes sense and I advise her to direct her questions regarding thyroid replacement to endocrinology clinic.  5.    History of miscarriages.  Hematology evaluation previously done, with plan to repeat antiphospholipid antibody level.  I will defer to patient/oncology about follow-up for that.  6.  Celiac disease.  She has had GI follow-up and I think it makes sense to direct any residual questions regarding celiac to the GI clinic.  Would continue celiac diet in the meantime.  7.    Myalgias.  Previous rheumatology evaluation.  Per rheumatology notes, she was negative for evidence of connective tissue disease.  I think it makes sense for her to check with rheumatology to see if they think it would be better to increase her gabapentin versus increase Cymbalta versus change to a different medication regarding her pain.  This plan is based on my encounter with the patient with subsequent record review.  I appreciate the help of all involved.  33 minutes spent with record review and planning, separate from office visit, and performed on a separate day

## 2021-03-29 NOTE — Telephone Encounter (Signed)
I appreciate the feedback.  I do not understand this perspective, and I have always done my best to handle the patient's concerns and medical issues.  I will let the medical record speak for itself.  I have never thought that she was drug-seeking or trying to obtain any illicit substance.  She did send me a recent message regarding pain and recent diagnosis of fibromyalgia and myofascial pain.  My recent 03/10/2021 documentation in [facility]: "SNRI's, TCA's, gabapentin, and tramadol are first line treatment for fibromyalgia.  Opioids are not prefered for myofascial pain syndromes. Rheumatology has also followed this algorithm."  I recommended that she stay on her current medication without changes for a few weeks to assess effect.  I had the patient see Endocrinology as well as other subspecialists and helped coordinate care over multiple years.    I believe that my treatment patterns in this case follow current evidence based medicine.

## 2021-03-29 NOTE — Telephone Encounter (Signed)
[facility] in [facility] calling back stating referral will need to be sent to main campus at [place] since everything that patient had done at [facility] they would do as well.

## 2021-03-30 ENCOUNTER — Institutional Professional Consult (permissible substitution): Payer: 59 | Admitting: Cardiology

## 2021-03-30 ENCOUNTER — Other Ambulatory Visit: Payer: Self-pay | Admitting: Internal Medicine

## 2021-03-30 ENCOUNTER — Telehealth: Payer: Self-pay | Admitting: Family Medicine

## 2021-03-30 ENCOUNTER — Ambulatory Visit: Payer: 59 | Admitting: Internal Medicine

## 2021-03-30 ENCOUNTER — Encounter: Payer: Self-pay | Admitting: Internal Medicine

## 2021-03-30 ENCOUNTER — Telehealth: Payer: Self-pay | Admitting: Internal Medicine

## 2021-03-30 DIAGNOSIS — E038 Other specified hypothyroidism: Secondary | ICD-10-CM

## 2021-03-30 DIAGNOSIS — E063 Autoimmune thyroiditis: Secondary | ICD-10-CM

## 2021-03-30 DIAGNOSIS — G4710 Hypersomnia, unspecified: Secondary | ICD-10-CM | POA: Diagnosis not present

## 2021-03-30 DIAGNOSIS — K900 Celiac disease: Secondary | ICD-10-CM | POA: Diagnosis not present

## 2021-03-30 DIAGNOSIS — M797 Fibromyalgia: Secondary | ICD-10-CM | POA: Diagnosis not present

## 2021-03-30 NOTE — Telephone Encounter (Signed)
Record review done.  Multiple subspecialty notes reviewed.  This is completely separate from all time related to office visit and documentation of office visit.  Please update patient about this-multiple items listed below.  1.  Right arm and leg tingling discussed at office visit, will await orthopedic notes.  She may end up needing additional neck imaging, but I need to review those notes first.  If her neck imaging is unremarkable then she may end up needing neurology referral.  2.  Cardiology evaluation ongoing with ambulatory monitor.  I will defer to cardiology about that.  3.  Pulmonary notes state plan for sleep evaluation.  I do not see sleep test results yet.  I think it makes sense for her to contact pulmonary if she has not yet completed that testing.  4.  Hashimoto's. Endocrine notes reviewed.  The plan from endocrine makes sense and I advise her to direct her questions regarding thyroid replacement to endocrinology clinic.  5.  Hematology evaluation previously done, with plan to repeat antiphospholipid antibody level.  I will defer to patient/oncology about follow-up for that.  6.  Celiac disease.  She has had GI follow-up and I think it makes sense to direct any residual questions regarding celiac to the GI clinic.  Would continue celiac diet in the meantime.  7.  Previous rheumatology evaluation.  Per rheumatology notes, she was negative for evidence of connective tissue disease.  I think it makes sense for her to check with rheumatology to see if they think it would be better to increase her gabapentin versus increase Cymbalta versus change to a different medication regarding her pain.  This plan is based on my encounter with the patient with subsequent record review.  I appreciate the help of all involved.  33 minutes spent with record review and planning, separate from office visit, and performed on a separate day

## 2021-03-30 NOTE — Assessment & Plan Note (Addendum)
I told her I need to review her medical record.  I did not make any suggestions regarding her care at this point.  She understands that.  It appears that there are 3 main issues for consideration.  One would be her recent arm and leg symptoms.  Another is her muscle pain/pain in general.  The third category would be her other medical issues.  I will review her record and offer suggestions.  I am awaiting her notes from orthopedics in the meantime. 35 minutes were devoted to patient care in this encounter (this includes time spent reviewing the patient's file/history, interviewing and examining the patient, counseling/reviewing plan with patient).

## 2021-03-30 NOTE — Telephone Encounter (Signed)
Called [facility] and spoke with Misha who stated that they were needing medical documents sent as the RN with [facility] needs them to validate for authorization so they can see if they can approve the home sleep study to be done.  PCCs, please advise.

## 2021-03-30 NOTE — Telephone Encounter (Signed)
Thyroid test ordered, can you please fax the order to her preferred location ?  Thanks

## 2021-03-30 NOTE — Telephone Encounter (Signed)
Order have been released to [facility] and faxed it as well.

## 2021-03-31 ENCOUNTER — Other Ambulatory Visit: Payer: Self-pay

## 2021-03-31 DIAGNOSIS — K900 Celiac disease: Secondary | ICD-10-CM

## 2021-03-31 NOTE — Telephone Encounter (Signed)
According documentation below per Chilton Greathouse, RN, pt was to be seen urgently by a Celiac Specialist. Also appears she had been informed by Vickey Huger that was a Celiac Specialist at that location in [place]. Called [phone] for clarification about this request to ensure there wasn't a misunderstanding of our request. Per scheduling dept, Weston Brass, [place] advised pt would be best served by [employer] as they would not change pt current regimen. Asked scheduling dept if Weston Brass, [place] was a Celiac Specialist whom the pt was to see. Scheduler could not answer that question and transferred me to Darl Pikes, [place].  Per Darl Pikes, RN, they do not have any Celiac Specialists at the [facility] location. Apologized that our office had been misinformed by Vickey Huger that they did have such a specialist. Provided me with contact [phone]. Advised me this was NOT the clinic # but that the procedure schedulers could provide me with the correct #.  Called [phone] and spoke with scheduler. She informed me that I did in fact have the correct location for a Celiac Specialist with the [employer]. Provided me with [phone] and advised I call to obtain the referral fax #.  Called [phone], however, this was a fax# for the clinic.   Referral placed for pt to be seen by Celiac Specialist Dr. Truitt Merle, phone #[phone]. Faxed all records to [phone] and [phone]. Confirmation received. Will hold all documents for follow up purposes and to ensure pt has been scheduled appropriately.

## 2021-04-01 ENCOUNTER — Inpatient Hospital Stay: Payer: 59

## 2021-04-03 ENCOUNTER — Encounter: Payer: Self-pay | Admitting: Family Medicine

## 2021-04-04 ENCOUNTER — Encounter (HOSPITAL_BASED_OUTPATIENT_CLINIC_OR_DEPARTMENT_OTHER): Payer: 59 | Admitting: Internal Medicine

## 2021-04-04 ENCOUNTER — Encounter: Payer: Self-pay | Admitting: Oncology

## 2021-04-04 NOTE — Telephone Encounter (Signed)
Message below sent to patient via mchart.

## 2021-04-04 NOTE — Telephone Encounter (Signed)
She called me 2 weeks ago and told me it was already approved and someone who be calling the next week to pick up the device.. so that makes no sense.  Patient is requesting to schedule HST.

## 2021-04-04 NOTE — Telephone Encounter (Signed)
Called to follow up on status of referral to [facility]; CELIAC SPECIALIST. States they do not accept [employer]. Therefor, pt will need to initiate contact with their office to advise how she intends to pay for her visit. Referral coordinator indicated they will not reach out to pt but will be happy to schedule IF she calls them AND intends to be self pay. Routing message to Dr. Orvan Falconer to make her aware of ongoing issue with referral

## 2021-04-04 NOTE — Telephone Encounter (Signed)
Per 03/30/21 message-  Called [facility] and spoke with Misha who stated that they were needing medical documents sent as the RN with [facility] needs them to validate for authorization so they can see if they can approve the home sleep study to be done.  PCCs, please advise.

## 2021-04-05 ENCOUNTER — Other Ambulatory Visit: Payer: Self-pay

## 2021-04-05 ENCOUNTER — Telehealth: Payer: 59 | Admitting: Oncology

## 2021-04-05 ENCOUNTER — Other Ambulatory Visit: Payer: Self-pay | Admitting: Family Medicine

## 2021-04-05 ENCOUNTER — Inpatient Hospital Stay: Payer: 59 | Admitting: Oncology

## 2021-04-05 ENCOUNTER — Encounter (HOSPITAL_BASED_OUTPATIENT_CLINIC_OR_DEPARTMENT_OTHER): Payer: 59 | Admitting: Internal Medicine

## 2021-04-05 ENCOUNTER — Telehealth: Payer: Self-pay | Admitting: Family Medicine

## 2021-04-05 ENCOUNTER — Encounter: Payer: Self-pay | Admitting: Internal Medicine

## 2021-04-05 DIAGNOSIS — R202 Paresthesia of skin: Secondary | ICD-10-CM

## 2021-04-05 LAB — TSH: TSH: 2.09 u[IU]/mL (ref 0.450–4.500)

## 2021-04-05 MED ORDER — TRAMADOL HCL 50 MG PO TABS: 50.0000 mg | ORAL_TABLET | Freq: Three times a day (TID) | ORAL | 0 refills | Status: DC | PRN

## 2021-04-05 NOTE — Telephone Encounter (Signed)
Please update patient.  I am not assuming the role of her PCP since I am not taking new patients.  I can offer 1 (one) refill on her tramadol to allow her to talk to rheumatology about long term pain treatment options.  I will await the MRI report and will address the results from that.  I sent the rx for tramadol.  Thanks.

## 2021-04-05 NOTE — Telephone Encounter (Deleted)
-----   Message from Joaquim Nam, MD sent at 04/05/2021 10:56 AM EDT ----- Regarding: FW: tramadol rx I will offer 1 refill on tramadol, order the MRI, and encourage her to f/u with rheum.     ----- Message ----- From: Judith Beat, MD Sent: 04/05/2021  10:13 AM EDT To: Joaquim Nam, MD Subject: RE: tramadol rx                                Shaw,   I don't think I can be involved in this case anymore or write prescriptions.  This would make me exceptionally uneasy.  I think tramadol is very reasonable, and I wrote her for some not too long ago.  My plan had been to increase her SNRI, gabapentin, and increase tramadol dosing as needed based on effect.  I think this is one of the main reasons that she abruptly fired me.   Rheum agreed and was doing this exact same thing.  I am not sure any answer will satisfy the patient.   I think an MRI of the c-spine is reasonable.  Generally for referred pain from the neck, most of [place] would try to max out neuropathic pain meds unless there is a surgical indication.  My practice pattern is to also consider other interventions like ESI, PT modalities, etc.  Before considering long-term narcotics.   You can call me if you want to. Karleen Hampshire  ----- Message ----- From: Joaquim Nam, MD Sent: 04/05/2021   9:46 AM EDT To: Judith Beat, MD Subject: tramadol rx                                    Are you okay rx'ing her tramadol?  What are you thoughts on the matter?  Call me if needed.  [phone].  Either way, I'd like your input.    I advised her to check with rheum re: pain meds in the meantime.    I am awaiting her ortho notes.  I put in order for MRI C spine given R arm and leg tingling.  I can refer her to neuro if that is normal.    Thanks.   Clelia Croft

## 2021-04-05 NOTE — Telephone Encounter (Signed)
LMTCB

## 2021-04-05 NOTE — Telephone Encounter (Signed)
Thank you. Please have her check with her insurance to determine the best options. Thank.

## 2021-04-05 NOTE — Telephone Encounter (Signed)
Called pt and LVM requesting returned call. 

## 2021-04-06 NOTE — Telephone Encounter (Signed)
Mrs. koning called returning phone call, related the message to her and she stated that she was told that Dr. Para March would be her provider per [facility] and the refill is only for 2 weeks and the rheumatologist doesn't handle the medication.

## 2021-04-06 NOTE — Telephone Encounter (Signed)
SECOND ATTEMPT:  LVM requesting returned call. Last conversation, pt was aware to call to self schedule. Will address during next OV with Dr. Orvan Falconer.

## 2021-04-06 NOTE — Telephone Encounter (Signed)
I will call patient on Thursday and discuss this.  I was very clear in our conversation and she confirmed understanding that she would transfer care to [facility] in July but in the interim we would take care of her acute needs.  I will speak with Dr. Para March regarding her medical and medication needs and contact patient back tomorrow (Thursday 5/26) to remind her of our discussion.

## 2021-04-07 ENCOUNTER — Encounter: Payer: Self-pay | Admitting: Oncology

## 2021-04-07 ENCOUNTER — Inpatient Hospital Stay: Payer: 59 | Attending: Oncology | Admitting: Oncology

## 2021-04-07 DIAGNOSIS — N96 Recurrent pregnancy loss: Secondary | ICD-10-CM

## 2021-04-07 NOTE — Telephone Encounter (Signed)
Noted. Thanks for your help in this matter.

## 2021-04-08 ENCOUNTER — Other Ambulatory Visit: Payer: Self-pay

## 2021-04-08 DIAGNOSIS — R0683 Snoring: Secondary | ICD-10-CM

## 2021-04-08 DIAGNOSIS — G4710 Hypersomnia, unspecified: Secondary | ICD-10-CM

## 2021-04-08 NOTE — Telephone Encounter (Signed)
Fax place in Dr Tirr [facility] inbox

## 2021-04-08 NOTE — Telephone Encounter (Addendum)
Called patient to discuss her concerns further.  She explained that Dr. Para March is refusing to give her more than a 2 week prescription of Tramadol which will not carry her until her July appointment.  I did discuss this with Dr. Para March prior to calling her back as she left this on my voicemail as being her main concern.   Dr. Para March and I discussed the fact that the Tramadol would not last her until July.  He understands that but needs to hear from the rheumatology team regarding long term pain medication management options before continuing.  He states that if they say they want [place] to manage the tramadol and they would not then he would manage as needed until she established with care with Audria Nine our new NP coming in July.   When I explained this to patient she called me a liar and stated that she received a phone call and message from Dr. Para March stating that he would NOT be filling it again.  When trying to explain the rheumatology piece she first said "I have already talked with them and they are only going to manage the gabapentin and he needs to manage the tramadol and I have notes on that."  I replied that this is great that she has made that contact and asked if she could provide [place] with the notes or ask that rheumatology forward to [place] for continuity of care as this will help ensure the providers are on the same page with controlling her pain and care needs .  She became very angry and said we should be the ones to reach out as Dr.Copland referred her and asked how this was good care?  She has dismissed Dr. Patsy Lager as her primary.  I explained that as Dr. Para March is not her primary and didn't refer her that the rheumatology office may require her permission to communicate with him and that was likely why he asked for her to reach out.   Patient continued to talk over me claiming that I am a liar and "clearly couldn't handle this situation " as I told her I had spoken with Dr. Para March prior to her  appointment and states that Dr. Para March told her that I did not speak with him and that he was only told the day before and had no time to review her chart before her appointment.  She went on to say that she is tired of one person from our office telling her one thing and then another stating something different.  In addition, she claims our treatment as "malpractice" and she would like to discuss with someone higher than me to make a complaint further.  She also states that she considers it poor practice that she still has not received a call about when her MRI will be.  In reviewing the chart, I see that the order was placed on 5/24 and is currently undergoing a PA for coverage before the appt could be made.  She did not allow me time to research this further as she continued to be angry expressing distrust in her care team.  When I verbalized this back to her, she states that I am right she does not trust the care that we are giving and did not want to discuss with me further.  I informed her that I would have someone above me contact her to discuss further.  We said thank you and ended the call.    I have made my  assistant director, Gypsy Decant, aware and she and or office of patient experience will reach out to patient to discuss where we go from here.

## 2021-04-08 NOTE — Telephone Encounter (Signed)
My medical rec's still stand.  I have called to get approval for her  C spine MRI.  I don't know when it will be scheduled.  I did d/w Angelica Chessman about the patient's situation but that was after the initial consult and in anticipation of this call.    I will d/w rheumatology if needed/contacted by rheumatology.  If she has the MRI done, I will address the results.  O/w it appears that she will need to seek care elsewhere.
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
## 2021-04-12 NOTE — Telephone Encounter (Signed)
Unable to reach [email or web address] # faxed records to the fax# they left Tobe Sos

## 2021-04-15 DIAGNOSIS — R0683 Snoring: Secondary | ICD-10-CM

## 2021-04-15 NOTE — Telephone Encounter (Signed)
CY please advise on the sleep study results per pts request.  Thanks

## 2021-04-18 NOTE — Telephone Encounter (Signed)
Home sleep test was barely above the normal range. Upper limit of normal is 5 apneas/ hour and we consider it mild up to 15/ hour. Her score was 5.2/ hour.  We don't usually need to treat scores in this range, except to encourage people to keep weight down and to sleep off flat of back.  Ok to make return appointment if she wishes to discuss this more in person.

## 2021-04-18 NOTE — Telephone Encounter (Signed)
Dr. Maple Hudson, Patient is requesting sleep result.  HST was completed 04/08/21.

## 2021-04-25 ENCOUNTER — Encounter: Payer: Self-pay | Admitting: Oncology

## 2021-04-25 ENCOUNTER — Ambulatory Visit: Payer: 59 | Admitting: Gastroenterology

## 2021-04-25 NOTE — Telephone Encounter (Signed)
NS for today's appt. Per last entry, pt will need to call her insurance company to determine what Celiac Specialist is covered by her plan. No further action re: referral will be taken at this time as we will wait to hear from pt re: covered provider.

## 2021-04-25 NOTE — Progress Notes (Signed)
Nothing more to do

## 2021-04-26 ENCOUNTER — Other Ambulatory Visit: Payer: 59

## 2021-04-28 ENCOUNTER — Inpatient Hospital Stay: Admission: RE | Admit: 2021-04-28 | Payer: 59 | Source: Ambulatory Visit

## 2021-04-29 ENCOUNTER — Other Ambulatory Visit: Payer: 59

## 2021-05-01 ENCOUNTER — Other Ambulatory Visit: Payer: 59

## 2021-05-02 ENCOUNTER — Ambulatory Visit
Admission: RE | Admit: 2021-05-02 | Discharge: 2021-05-02 | Disposition: A | Discharge status: Home / Self Care | Payer: 59 | Source: Ambulatory Visit | Attending: Family Medicine | Admitting: Family Medicine

## 2021-05-02 DIAGNOSIS — M50223 Other cervical disc displacement at C6-C7 level: Secondary | ICD-10-CM | POA: Diagnosis not present

## 2021-05-02 DIAGNOSIS — M50222 Other cervical disc displacement at C5-C6 level: Secondary | ICD-10-CM | POA: Diagnosis not present

## 2021-05-02 DIAGNOSIS — R202 Paresthesia of skin: Secondary | ICD-10-CM

## 2021-05-03 ENCOUNTER — Other Ambulatory Visit: Payer: Self-pay | Admitting: Family Medicine

## 2021-05-03 ENCOUNTER — Encounter: Payer: Self-pay | Admitting: Family Medicine

## 2021-05-03 DIAGNOSIS — R202 Paresthesia of skin: Secondary | ICD-10-CM

## 2021-05-04 ENCOUNTER — Ambulatory Visit: Payer: 59 | Admitting: Internal Medicine

## 2021-05-06 ENCOUNTER — Ambulatory Visit: Payer: 59 | Admitting: Internal Medicine

## 2021-05-06 NOTE — Progress Notes (Deleted)
Name: Judith Martinez  MRN/ DOB: [id number], [birth year]    Age/ Sex: 35 y.o., female     PCP: Pcp, No   Reason for Endocrinology Evaluation: Hypothyroidism     Initial Endocrinology Clinic Visit: 07/26/2020    PATIENT IDENTIFIER: Judith Martinez is a 35 y.o., female with a past medical history of Hashimoto's thyroiditis, celiac disease, Fatty liver  And fibromyalgia.  She has followed with [facility] since 07/26/2020 for consultative assistance with management of her Hypothyroidism.   HISTORICAL SUMMARY:  She was diagnosed with Hashimoto's Thyroiditis 06/2020. Started on Lt-4 replacement . She was tired at the time as well as lightheadedness as well as weight gain. TSH was high as 4  .    Thyroid Ultrasound 07/2020 non- revealing   No FH of thyroid disease    Follows with rheumatology for fibromyalgia, on Naltrexone and Gabapentin   SUBJECTIVE:    Today (05/06/2021):  Judith Martinez is here for a follow up on Hypothyroidism. She is here to establish care.   Pt has 5 kids prior to her current marriage, and has been trying to conceive for 6 yrs since her marriage without success. She had a tubal ligation reversal in 2015   She states 12 " chemical pregnancies" but no viable and is under the impression that  Her thyroid has to do with it, highest TSH per was was 4.0 uIU/mL prior to initiating LT- 4 replacement    Continues with fatigue no change from before starting LT- 4 replacement  Has chronic sore throat and raspy voice at times Ultrasound unrevealing   LMP 2/17/ 2022    Medication:  Synthroid 75 mcg daily    HISTORY:  Past Medical History:  Past Medical History:  Diagnosis Date   Celiac disease 02/10/2021   Fibromyalgia    GERD (gastroesophageal reflux disease)    Hashimoto's thyroiditis 02/10/2021   History of kidney stones 2013   History of pyelonephritis 2012   Hx of migraines    Panic attacks 07/30/2018   Past Surgical History:  Past Surgical  History:  Procedure Laterality Date   BREAST ENHANCEMENT SURGERY  2012   BREAST IMPLANT REMOVAL  2021   TONSILLECTOMY  1994   TUBAL LIGATION  10/13   tubal reversal  2016   Social History:  reports that she has never smoked. She has never used smokeless tobacco. She reports current alcohol use. She reports that she does not use drugs. Family History:  Family History  Problem Relation Age of Onset   Hypertension Father    Migraines Father    Hyperlipidemia Father    CAD Paternal Grandfather    Parkinson's disease Paternal Grandfather    Cancer Paternal Grandfather        leukemia   Stroke Paternal Grandfather    Colon polyps Paternal Grandfather    Cancer Sister 4       tumor in leg   Cancer Other        maternal great grandmother; breast   Diabetes Paternal Grandmother    Colon polyps Paternal Grandmother    Fibromyalgia Mother    Colon cancer Maternal Grandfather    Thyroid cancer Neg Hx    Pancreatic cancer Neg Hx    Esophageal cancer Neg Hx      HOME MEDICATIONS: Allergies as of 05/06/2021   No Known Allergies      Medication List        Accurate as of May 06, 2021  12:43 PM. If you have any questions, ask your nurse or doctor.          DULoxetine 30 MG capsule Commonly known as: CYMBALTA TAKE 1 CAPSULE BY MOUTH EVERY DAY   gabapentin 300 MG capsule Commonly known as: NEURONTIN Take 300 mg by mouth 3 (three) times daily.   Synthroid 75 MCG tablet Generic drug: levothyroxine Take 1 tablet (75 mcg total) by mouth daily before breakfast.   traMADol 50 MG tablet Commonly known as: ULTRAM Take 1 tablet (50 mg total) by mouth 3 (three) times daily as needed (for pain).          OBJECTIVE:   PHYSICAL EXAM: VS: There were no vitals taken for this visit.   EXAM: General: Pt tearful   Neck: General: Supple without adenopathy. Thyroid: Thyroid size normal.  No goiter or nodules appreciated. No thyroid bruit.  Lungs: Clear with good BS bilat  with no rales, rhonchi, or wheezes  Heart: Auscultation: RRR.  Abdomen: Normoactive bowel sounds, soft, nontender, without masses or organomegaly palpable  Extremities:  BL LE: No pretibial edema normal ROM and strength.  Mental Status: Judgment, insight: Intact Memory: Intact for recent and remote events Mood and affect: No depression, anxiety, or agitation     DATA REVIEWED:    Results for Judith Martinez, Judith Martinez (MRN [id number]) as of 02/02/2021 13:03  Ref. Range 08/19/2020 14:33  Thyroperoxidase Ab SerPl-aCnc Latest Ref Range: 0 - 34 IU/mL 240 (H)     Thyroid ultrasound 07/2020 Markedly heterogeneous and potentially hyperemic thyroid without discrete nodule or mass. Findings are nonspecific though compatible with provided history of Hashimoto's thyroiditis  ASSESSMENT / PLAN / RECOMMENDATIONS:   Hashimoto's Thyroiditis:   - Pt with multiple non-specific symptoms . Give normal TSh these symptoms could not be attributed to her thyroid at this time  - NO local neck symptom , mainly throat issues  - She takes LT- 4 replacement correctly.  - Pt with Positive HCG. TSH on  Lower end of normal , no change to her synthroid dose at this time . Will check in 4 weeks    - TSH goal during first trimester in 0.1- 2.5 uIU/mL    Medications  Continue Synthroid 75 mcg daily   F/U in 2 months  Labs in 4 weeks    Signed electronically by: Lyndle Herrlich, MD  [facility] Endocrinology  [facility] [address] [place] [place], [place] [address] Phone: [phone] FAX: [phone]      CC: Pcp, No No address on file Phone: None  Fax: None   Return to Endocrinology clinic as below: Future Appointments  Date Time Provider Department Center  05/06/2021  2:40 PM Aiyannah Fayad, Konrad Dolores, MD [facility] None  06/06/2021  1:00 PM Delano Metz, MD [facility] None  08/01/2021 10:00 AM [facility] DX DEXA 1 [facility]  08/04/2021 11:30 AM Anson Fret, MD [facility] None

## 2021-05-20 DIAGNOSIS — M797 Fibromyalgia: Secondary | ICD-10-CM | POA: Diagnosis not present

## 2021-05-28 ENCOUNTER — Encounter: Payer: Self-pay | Admitting: Internal Medicine

## 2021-06-06 ENCOUNTER — Ambulatory Visit: Payer: 59 | Admitting: Pain Medicine

## 2021-06-20 ENCOUNTER — Ambulatory Visit (INDEPENDENT_AMBULATORY_CARE_PROVIDER_SITE_OTHER): Payer: 59 | Admitting: Internal Medicine

## 2021-06-20 ENCOUNTER — Encounter: Payer: Self-pay | Admitting: Internal Medicine

## 2021-06-20 ENCOUNTER — Other Ambulatory Visit: Payer: Self-pay

## 2021-06-20 VITALS — BP 108/68 | HR 72 | Temp 98.0°F | Ht 66.0 in | Wt 157.0 lb

## 2021-06-20 DIAGNOSIS — M797 Fibromyalgia: Secondary | ICD-10-CM

## 2021-06-20 DIAGNOSIS — G4489 Other headache syndrome: Secondary | ICD-10-CM

## 2021-06-20 DIAGNOSIS — R449 Unspecified symptoms and signs involving general sensations and perceptions: Secondary | ICD-10-CM | POA: Insufficient documentation

## 2021-06-20 DIAGNOSIS — R519 Headache, unspecified: Secondary | ICD-10-CM | POA: Insufficient documentation

## 2021-06-20 MED ORDER — ONDANSETRON HCL 4 MG PO TABS: 4.0000 mg | ORAL_TABLET | Freq: Three times a day (TID) | ORAL | 0 refills | Status: DC | PRN

## 2021-06-20 MED ORDER — AMITRIPTYLINE HCL 10 MG PO TABS: 10.0000 mg | ORAL_TABLET | Freq: Every day | ORAL | 1 refills | Status: DC

## 2021-06-20 NOTE — Patient Instructions (Signed)
Please take the ondansetron to prevent nausea at the onset of any headache. You can take tylenol or ibuprofen also if you need to. Start the amitriptyline at bedtime with 1 (10mg ) daily. If no problems, increase to 20mg  (2) after a few days. Let me know if you don't have any problems with it but it is not helping (like after 2 weeks).

## 2021-06-20 NOTE — Assessment & Plan Note (Signed)
This may be leading to non restorative sleep and adding to headaches Will try low dose amitriptyline 10mg  ----to 20mg 

## 2021-06-20 NOTE — Assessment & Plan Note (Signed)
This is not migraine N/V Sun sensitivity Focal sensory symptoms on right  Will check MRI brain Ondansetron for nausea (along with tylenol/ibuprofen)

## 2021-06-20 NOTE — Assessment & Plan Note (Signed)
On right side Along with the headache, this is the reason for imaging Also is due to see neurology
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
## 2021-06-24 MED ORDER — BUTALBITAL-APAP-CAFFEINE 50-325-40 MG PO TABS: 1.0000 | ORAL_TABLET | Freq: Every day | ORAL | 0 refills | Status: DC | PRN

## 2021-06-24 NOTE — Telephone Encounter (Signed)
Dr Alphonsus Sias sent a message stating if she calls to be seen, he will add her on Monday 06-27-21 at 130pm.

## 2021-06-27 NOTE — Telephone Encounter (Signed)
I'm sorry to hear that she's still having problems. I would recommend an office visit with me or Jill Side so that we can work to answer her questions. Thanks.

## 2021-06-29 ENCOUNTER — Encounter: Payer: 59 | Admitting: Nurse Practitioner

## 2021-07-01 NOTE — Telephone Encounter (Signed)
After several attempts to initiate Precert for this MRI Brain - site has been down and I have had difficulty getting through on the phone, It has been precerted and is pending.    Called [employer] [phone] and started the claim. This has gone to Medical Review and Dr Alphonsus Sias has to do a Peer-to-Peer next week (within 10 business day from today date 07/01/21)   Reference # [id number]   Case will close on 07/04/21 but they will allow 10 business days to complete the Peer to Peer and still approve the scan.   **Dr Alphonsus Sias, please call Medical Review department for [facility] asap to speak with Medical Review Team [phone].  Use Reference # [id number] Please also tell them this is to be performed at [facility] [address] [place]  - [place] for site [phone]    Medicaid has also been precerted and is pending with insurance, this sometimes takes time to get approved. Will hopefully have an approval from them by next week.

## 2021-07-11 ENCOUNTER — Ambulatory Visit: Payer: 59 | Admitting: Nurse Practitioner

## 2021-07-11 NOTE — Progress Notes (Deleted)
     07/11/2021 CORLEY KOHLS [id number] 09/12/1986   Chief Complaint:  History of Present Illness: Judith Martinez is a 35 year old female with a past medical history of  pyelonephritis, migraine headaches, Hashimoto's thyroiditis and chronic constipation. Past tonsillectomy, tubal ligation, tubal reversal and breast augmentation surgery and s/p breast implant removal.   Abdominal CT with oral and IV contrast 01/17/2021: 1. Mild diffuse hepatic steatosis. 2. Formed stool throughout the visualized colon.  No evidence of small bowel dilatation.  No evidence of bowel wall thickening or inflammation. 3.  Normal pancreas.    EGD 01/10/2021: - Normal esophagus. Biopsied. - Gastritis. Biopsied. - Normal examined duodenum. Biopsied. - The examination was otherwise normal   Colonoscopy 01/10/2021: - Preparation of the colon was unsatisfactory. - Non-bleeding external and internal hemorrhoids. - Stool in the entire examined colon limiting an evaluation for small and medium-sized lesions. - The examination was otherwise normal on direct and retroflexion views. Biopsy Report: 1. Surgical [P], duodenum - DUODENAL MUCOSA WITH INTRAEPITHELIAL LYMPHOCYTOSIS AND MILD VILLOUS ARCHITECTURAL CHANGES. SEE NOTE 2. Surgical [P], fundus, gastric antrum and gastric body - GASTRIC ANTRAL AND OXYNTIC MUCOSA WITH NO SPECIFIC HISTOPATHOLOGIC CHANGES - WARTHIN STARRY STAIN IS NEGATIVE FOR HELICOBACTER PYLORI 3. Surgical [P], distal esophagus - ESOPHAGEAL SQUAMOUS AND CARDIAC MUCOSA WITH REFLUX-ASSOCIATED CHANGES - NEGATIVE FOR INTESTINAL METAPLASIA OR DYSPLASIA 4. Surgical [P], mid and proximal esophagus - ESOPHAGEAL SQUAMOUS MUCOSA WITH MILD VASCULAR CONGESTION, AND FOCAL SQUAMOUS BALLOONING, SUGGESTIVE OF REFLUX ESOPHAGITIS - NEGATIVE FOR INCREASED INTRAEPITHELIAL EOSINOPHILS Diagnosis Note 1. The histologic findings appear compatible with celiac disease in an appropriate clinical setting. Differential  diagnosis can include tropical sprue, food allergy, immune deficiencies (e.g. IgA deficiency, common variable immunodeficiency), viral enteritis, Giardiasis, blind loop syndrome, Crohn disease, autoimmune enteropathy and drugs (e.g. NSAIDs).      Current Medications, Allergies, Past Medical History, Past Surgical History, Family History and Social History were reviewed in [employer] record.   Review of Systems:   Constitutional: Negative for fever, sweats, chills or weight loss.  Respiratory: Negative for shortness of breath.   Cardiovascular: Negative for chest pain, palpitations and leg swelling.  Gastrointestinal: See HPI.  Musculoskeletal: Negative for back pain or muscle aches.  Neurological: Negative for dizziness, headaches or paresthesias.    Physical Exam: There were no vitals taken for this visit. General: Well developed, w   ***female in no acute distress. Head: Normocephalic and atraumatic. Eyes: No scleral icterus. Conjunctiva pink . Ears: Normal auditory acuity. Mouth: Dentition intact. No ulcers or lesions.  Lungs: Clear throughout to auscultation. Heart: Regular rate and rhythm, no murmur. Abdomen: Soft, nontender and nondistended. No masses or hepatomegaly. Normal bowel sounds x 4 quadrants.  Rectal: *** Musculoskeletal: Symmetrical with no gross deformities. Extremities: No edema. Neurological: Alert oriented x 4. No focal deficits.  Psychological: Alert and cooperative. Normal mood and affect  Assessment and Recommendations: ***

## 2021-07-13 ENCOUNTER — Other Ambulatory Visit: Payer: Self-pay | Admitting: Internal Medicine

## 2021-07-13 ENCOUNTER — Telehealth: Payer: Self-pay

## 2021-07-13 ENCOUNTER — Other Ambulatory Visit: Payer: 59

## 2021-07-13 NOTE — Telephone Encounter (Signed)
[facility] Day - Client Nonclinical Telephone Record AccessNurse Client [facility] Day - Client Client Site [facility] - Day Physician Tillman Abide- MD Contact Type Call Who Is Calling Physician / Provider / Hospital Call Type Provider Call Message Only Reason for Call Request to send message to Office Initial Comment Caller states she is calling from [facility] in regards to a prior authorization that is needed. Additional [address]Roseanne Reno- [phone] Patient- Judith Martinez DOB- 1985-11-14 Caller is needing a prior authorization. Disp. Time Disposition Final User 07/13/2021 12:31:06 PM General Information Provided Yes Darliss Ridgel Call Closed By: Darliss Ridgel Transaction Date/Time: 07/13/2021 12:28:01 PM (ET)

## 2021-07-13 NOTE — Telephone Encounter (Signed)
Last OV 06/20/21 Last refill 06/20/21 #60/1 *Request for 90 day supply Next OV 07/21/21

## 2021-07-14 NOTE — Telephone Encounter (Signed)
Please call her to find out what she is asking for. I thought we were going to set up a virtual visit with me today to see what action was needed about the issues she is having I have no idea what kind of prior authorization she is asking for

## 2021-07-15 ENCOUNTER — Ambulatory Visit: Payer: 59

## 2021-07-15 NOTE — Telephone Encounter (Signed)
Last OV - 06/20/2021 Next OV - 07/21/2021 Last Filled - 06/24/2021

## 2021-07-15 NOTE — Telephone Encounter (Signed)
Tried calling patient and she did not answer LVM for her to call me back.

## 2021-07-21 ENCOUNTER — Ambulatory Visit: Admission: RE | Admit: 2021-07-21 | Payer: 59 | Source: Ambulatory Visit

## 2021-07-21 ENCOUNTER — Ambulatory Visit: Payer: 59 | Admitting: Internal Medicine

## 2021-07-22 ENCOUNTER — Ambulatory Visit: Payer: 59

## 2021-07-26 NOTE — Progress Notes (Deleted)
No-show to initial patient evaluation.

## 2021-07-27 ENCOUNTER — Ambulatory Visit: Payer: 59 | Admitting: Pain Medicine

## 2021-07-27 ENCOUNTER — Other Ambulatory Visit: Payer: Self-pay | Admitting: Pain Medicine

## 2021-07-27 ENCOUNTER — Other Ambulatory Visit: Payer: Self-pay | Admitting: Endocrinology

## 2021-07-27 DIAGNOSIS — Z5329 Procedure and treatment not carried out because of patient's decision for other reasons: Secondary | ICD-10-CM

## 2021-07-27 DIAGNOSIS — G894 Chronic pain syndrome: Secondary | ICD-10-CM | POA: Insufficient documentation

## 2021-07-27 DIAGNOSIS — Z91199 Patient's noncompliance with other medical treatment and regimen due to unspecified reason: Secondary | ICD-10-CM

## 2021-07-27 DIAGNOSIS — Z789 Other specified health status: Secondary | ICD-10-CM | POA: Insufficient documentation

## 2021-07-27 DIAGNOSIS — G8929 Other chronic pain: Secondary | ICD-10-CM | POA: Insufficient documentation

## 2021-07-27 DIAGNOSIS — M899 Disorder of bone, unspecified: Secondary | ICD-10-CM | POA: Insufficient documentation

## 2021-07-27 DIAGNOSIS — Z79899 Other long term (current) drug therapy: Secondary | ICD-10-CM | POA: Insufficient documentation

## 2021-07-27 NOTE — Progress Notes (Signed)
No-show to initial pain management evaluation on 07/27/2021.

## 2021-07-28 ENCOUNTER — Encounter: Payer: Self-pay | Admitting: Internal Medicine

## 2021-07-28 ENCOUNTER — Other Ambulatory Visit: Payer: Self-pay

## 2021-07-28 ENCOUNTER — Ambulatory Visit (INDEPENDENT_AMBULATORY_CARE_PROVIDER_SITE_OTHER): Payer: 59 | Admitting: Internal Medicine

## 2021-07-28 VITALS — BP 102/68 | HR 60 | Ht 66.0 in | Wt 159.6 lb

## 2021-07-28 DIAGNOSIS — E038 Other specified hypothyroidism: Secondary | ICD-10-CM

## 2021-07-28 DIAGNOSIS — E063 Autoimmune thyroiditis: Secondary | ICD-10-CM

## 2021-07-28 LAB — TSH: TSH: 3.4 u[IU]/mL (ref 0.35–5.50)

## 2021-07-28 MED ORDER — SYNTHROID 75 MCG PO TABS: 75.0000 ug | ORAL_TABLET | Freq: Every day | ORAL | 3 refills | Status: DC

## 2021-07-28 MED ORDER — SYNTHROID 100 MCG PO TABS: 100.0000 ug | ORAL_TABLET | Freq: Every day | ORAL | 1 refills | Status: DC

## 2021-07-28 NOTE — Progress Notes (Signed)
Name: Judith Martinez  MRN/ DOB: [id number], Apr 25, 1986    Age/ Sex: 35 y.o., female     PCP: Karie Schwalbe, MD   Reason for Endocrinology Evaluation: Hypothyroidism     Initial Endocrinology Clinic Visit: 07/26/2020    PATIENT IDENTIFIER: Judith Martinez is a 35 y.o., female with a past medical history of Hashimoto's thyroiditis, celiac disease, Fatty liver  And fibromyalgia.  She has followed with [facility] since 07/26/2020 for consultative assistance with management of her Hypothyroidism.   HISTORICAL SUMMARY:  She was diagnosed with Hashimoto's Thyroiditis 06/2020. Started on Lt-4 replacement . She was tired at the time as well as lightheadedness as well as weight gain. TSH was high as 4  .    Thyroid Ultrasound 07/2020 non- revealing   No FH of thyroid disease    Follows with rheumatology for fibromyalgia, on Naltrexone and Gabapentin   SUBJECTIVE:    Today (07/28/2021):  Judith Martinez is here for a follow up on Hypothyroidism.    She has been getting tired a lot ,she does not sleep well  She follows with infertility clinical virtually, at this time she is taking a break from conception   Has chronic constipation  Denies palpitations  Denies local neck swelling   Ultrasound unrevealing   LMP 3 weeks ago       Pt has 5 kids prior to her current marriage, and has been trying to conceive for 6 yrs since her marriage without success. She had a tubal ligation reversal in 2015   She states 12 " chemical pregnancies" but no viable and is under the impression that  Her thyroid has to do with it, highest TSH per was was 4.0 uIU/mL prior to initiating LT- 4 replacement     Medication:  Synthroid 75 mcg , 2 tabs on Sundays and 1 tablet daily    HISTORY:  Past Medical History:  Past Medical History:  Diagnosis Date   Celiac disease 02/10/2021   Fibromyalgia    GERD (gastroesophageal reflux disease)    Hashimoto's thyroiditis 02/10/2021    History of kidney stones 2013   History of pyelonephritis 2012   Hx of migraines    Panic attacks 07/30/2018   Past Surgical History:  Past Surgical History:  Procedure Laterality Date   BREAST ENHANCEMENT SURGERY  2012   BREAST IMPLANT REMOVAL  2021   TONSILLECTOMY  1994   TUBAL LIGATION  10/13   tubal reversal  2016   Social History:  reports that she has never smoked. She has never used smokeless tobacco. She reports current alcohol use. She reports that she does not use drugs. Family History:  Family History  Problem Relation Age of Onset   Hypertension Father    Migraines Father    Hyperlipidemia Father    CAD Paternal Grandfather    Parkinson's disease Paternal Grandfather    Cancer Paternal Grandfather        leukemia   Stroke Paternal Grandfather    Colon polyps Paternal Grandfather    Cancer Sister 4       tumor in leg   Cancer Other        maternal great grandmother; breast   Diabetes Paternal Grandmother    Colon polyps Paternal Grandmother    Fibromyalgia Mother    Colon cancer Maternal Grandfather    Thyroid cancer Neg Hx    Pancreatic cancer Neg Hx    Esophageal cancer Neg Hx  HOME MEDICATIONS: Allergies as of 07/28/2021   No Known Allergies      Medication List        Accurate as of July 28, 2021  8:30 AM. If you have any questions, ask your nurse or doctor.          amitriptyline 10 MG tablet Commonly known as: ELAVIL TAKE 1-2 TABLETS (10-20 MG TOTAL) BY MOUTH AT BEDTIME.   butalbital-acetaminophen-caffeine 50-325-40 MG tablet Commonly known as: FIORICET TAKE 1-2 TABLETS BY MOUTH DAILY AS NEEDED FOR HEADACHE.   ondansetron 4 MG tablet Commonly known as: ZOFRAN Take 1 tablet (4 mg total) by mouth every 8 (eight) hours as needed for nausea or vomiting.   pregabalin 50 MG capsule Commonly known as: LYRICA Take 1 capsule by mouth in the morning and at bedtime.   Synthroid 75 MCG tablet Generic drug: levothyroxine Take 1  tablet (75 mcg total) by mouth daily before breakfast.          OBJECTIVE:   PHYSICAL EXAM: VS: BP 102/68 (BP Location: Left Arm, Patient Position: Sitting, Cuff Size: Small)   Pulse 60   Ht 5\' 6"  (1.676 m)   Wt 159 lb 9.6 oz (72.4 kg)   SpO2 97%   BMI 25.76 kg/m    EXAM: General: Pt tearful   Neck: General: Supple without adenopathy. Thyroid: Thyroid size normal.  No goiter or nodules appreciated. No thyroid bruit.  Lungs: Clear with good BS bilat with no rales, rhonchi, or wheezes  Heart: Auscultation: RRR.  Abdomen: Normoactive bowel sounds, soft, nontender, without masses or organomegaly palpable  Extremities:  BL LE: No pretibial edema normal ROM and strength.  Mental Status: Judgment, insight: Intact Memory: Intact for recent and remote events Mood and affect: No depression, anxiety, or agitation     DATA REVIEWED:  Results for Judith, Martinez (MRN Leatha Gilding) as of 07/28/2021 12:56  Ref. Range 07/28/2021 08:34  TSH Latest Ref Range: 0.35 - 5.50 uIU/mL 3.40     Results for Judith, Martinez (MRN Leatha Gilding) as of 02/02/2021 13:03  Ref. Range 08/19/2020 14:33  Thyroperoxidase Ab SerPl-aCnc Latest Ref Range: 0 - 34 IU/mL 240 (H)     Thyroid ultrasound 07/2020 Markedly heterogeneous and potentially hyperemic thyroid without discrete nodule or mass. Findings are nonspecific though compatible with provided history of Hashimoto's thyroiditis  ASSESSMENT / PLAN / RECOMMENDATIONS:   Hashimoto's Thyroiditis:   - Pt with fatigue  - She takes LT- 4 replacement correctly.  - Pt educated extensively on the correct way to take levothyroxine (first thing in the morning with water, 30 minutes before eating or taking other medications). - Pt encouraged to double dose the following day if she were to miss a dose given long half-life of levothyroxine. -Repeat TSH has trended up, will adjust the dose as below   Medications  Stop Synthroid 75 mcg daily  Start Synthroid 100  MCG daily     F/U in 6 months  Labs in 6-8 weeks   Signed electronically by: 08/2020, MD  [facility] Endocrinology  [facility] [address] [place], [place] Phone: [phone] FAX: [phone]      CC: [phone], MD [address] [place] Phone: [phone]  Fax: [phone]   Return to Endocrinology clinic as below: Future Appointments  Date Time Provider Department Center  08/01/2021 10:00 AM [facility] DX DEXA 1 [facility] GI-[facility] CE  08/02/2021  8:00 AM 08/04/2021  I, MD [facility] P[facility]  08/04/2021 11:30 AM Anson Fret, MD [facility] None  08/16/2021  9:00 AM Arnaldo Natal, NP [facility]

## 2021-07-28 NOTE — Patient Instructions (Signed)

## 2021-08-01 ENCOUNTER — Other Ambulatory Visit: Payer: 59

## 2021-08-02 ENCOUNTER — Encounter: Payer: Self-pay | Admitting: Internal Medicine

## 2021-08-02 ENCOUNTER — Ambulatory Visit (INDEPENDENT_AMBULATORY_CARE_PROVIDER_SITE_OTHER): Payer: 59 | Admitting: Internal Medicine

## 2021-08-02 ENCOUNTER — Other Ambulatory Visit: Payer: Self-pay

## 2021-08-02 DIAGNOSIS — G43019 Migraine without aura, intractable, without status migrainosus: Secondary | ICD-10-CM | POA: Diagnosis not present

## 2021-08-02 MED ORDER — AMITRIPTYLINE HCL 25 MG PO TABS: 25.0000 mg | ORAL_TABLET | Freq: Every day | ORAL | 3 refills | Status: DC

## 2021-08-02 NOTE — Patient Instructions (Signed)
Please take the 25mg  of amitriptyline nightly at bedtime. If the headache isn't much better, and you have no apparent side effects in 1 week, increase to 2 (50mg ) at bedtime

## 2021-08-02 NOTE — Telephone Encounter (Signed)
Patient saw Dr. Alphonsus Sias today and spoke on this matter.
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
## 2021-08-02 NOTE — Assessment & Plan Note (Signed)
Still having focal sensory issues----still awaiting MRI Going to neurologist---Dr Lucia Gaskins-- soon Will defer headache referral till her visit  Try increased amitriptyline 25mg  bedtime and then up to 50 if tolerated

## 2021-08-04 ENCOUNTER — Telehealth: Payer: Self-pay | Admitting: Neurology

## 2021-08-04 ENCOUNTER — Institutional Professional Consult (permissible substitution): Payer: Medicaid Other | Admitting: Neurology

## 2021-08-04 NOTE — Telephone Encounter (Signed)
Patient no showed new patient appointment in neurology today.  It appears as though she has a record of 20% no-shows and many cancellations in her history.  If she calls again she can be rescheduled with any physician here at [facility] however please try to impress that we have many patients waiting for appointments and if she cancels or no-shows again we may dismiss her from our practice based office policy.  Please ask her to schedule an appointment only if she intends to attend if even just to be courteous to all those people waiting for appointments.

## 2021-08-08 ENCOUNTER — Encounter: Payer: Self-pay | Admitting: Neurology

## 2021-08-13 ENCOUNTER — Other Ambulatory Visit: Payer: Self-pay | Admitting: Internal Medicine

## 2021-08-15 NOTE — Telephone Encounter (Signed)
Last filled 07-15-21 #20 Last OV 08-02-21 No Future OV [facility]

## 2021-08-16 ENCOUNTER — Ambulatory Visit: Payer: 59 | Admitting: Nurse Practitioner

## 2021-08-31 ENCOUNTER — Ambulatory Visit: Payer: 59

## 2021-09-06 ENCOUNTER — Other Ambulatory Visit: Payer: Self-pay | Admitting: Internal Medicine

## 2021-09-06 NOTE — Telephone Encounter (Signed)
Pt requesting 90 day supply. She no-showed her Neurology appt.

## 2021-09-07 ENCOUNTER — Telehealth: Payer: Self-pay | Admitting: Internal Medicine

## 2021-09-07 NOTE — Telephone Encounter (Signed)
[name] with [facility] called stating that [facility] needs a authorization for pt for a MRI of the Brain. [name] [phone] ext [phone].

## 2021-09-08 NOTE — Telephone Encounter (Signed)
Spoke with [name] I have made her aware that this has been approved again. Approval # [id number], it's  valid from 09/08/21 to 10/07/2021////Judith Martinez

## 2021-09-11 ENCOUNTER — Other Ambulatory Visit: Payer: Self-pay | Admitting: Internal Medicine

## 2021-09-12 ENCOUNTER — Ambulatory Visit: Payer: 59 | Admitting: Gastroenterology

## 2021-09-12 NOTE — Telephone Encounter (Signed)
Last filled 08-15-21 #20 Last OV 05-02-21 No Future OV [facility]

## 2021-09-13 ENCOUNTER — Other Ambulatory Visit: Payer: Self-pay

## 2021-09-13 ENCOUNTER — Ambulatory Visit
Admission: RE | Admit: 2021-09-13 | Discharge: 2021-09-13 | Disposition: A | Discharge status: Home / Self Care | Payer: 59 | Source: Ambulatory Visit | Attending: Internal Medicine | Admitting: Internal Medicine

## 2021-09-13 DIAGNOSIS — G4489 Other headache syndrome: Secondary | ICD-10-CM | POA: Diagnosis not present

## 2021-09-13 DIAGNOSIS — R449 Unspecified symptoms and signs involving general sensations and perceptions: Secondary | ICD-10-CM | POA: Diagnosis present

## 2021-09-20 ENCOUNTER — Encounter: Payer: Self-pay | Admitting: Internal Medicine

## 2021-09-26 ENCOUNTER — Telehealth: Payer: Self-pay

## 2021-09-26 NOTE — Telephone Encounter (Signed)
Pt calling; not currently a pt but has been in the past; recently found out she is preg; has had Betas done - has increased, dropped and now has increased again; she has only one tube.  Trying to see if she can find out if she is preg in the tube or not. No cramping or bleeding.  [phone]

## 2021-09-27 ENCOUNTER — Emergency Department
Admission: EM | Admit: 2021-09-27 | Discharge: 2021-09-27 | Disposition: A | Discharge status: Home / Self Care | Payer: 59 | Attending: Emergency Medicine | Admitting: Emergency Medicine

## 2021-09-27 ENCOUNTER — Other Ambulatory Visit: Payer: Self-pay

## 2021-09-27 ENCOUNTER — Emergency Department: Payer: 59

## 2021-09-27 DIAGNOSIS — O021 Missed abortion: Secondary | ICD-10-CM | POA: Insufficient documentation

## 2021-09-27 DIAGNOSIS — Z79899 Other long term (current) drug therapy: Secondary | ICD-10-CM | POA: Diagnosis not present

## 2021-09-27 DIAGNOSIS — R102 Pelvic and perineal pain: Secondary | ICD-10-CM

## 2021-09-27 DIAGNOSIS — E039 Hypothyroidism, unspecified: Secondary | ICD-10-CM | POA: Diagnosis not present

## 2021-09-27 DIAGNOSIS — Z2914 Encounter for prophylactic rabies immune globin: Secondary | ICD-10-CM | POA: Insufficient documentation

## 2021-09-27 LAB — CBC WITH DIFFERENTIAL/PLATELET
Abs Immature Granulocytes: 0.04 10*3/uL (ref 0.00–0.07)
Basophils Absolute: 0 10*3/uL (ref 0.0–0.1)
Basophils Relative: 1 %
Eosinophils Absolute: 0.4 10*3/uL (ref 0.0–0.5)
Eosinophils Relative: 4 %
HCT: 38 % (ref 36.0–46.0)
Hemoglobin: 12.9 g/dL (ref 12.0–15.0)
Immature Granulocytes: 1 %
Lymphocytes Relative: 19 %
Lymphs Abs: 1.6 10*3/uL (ref 0.7–4.0)
MCH: 29.8 pg (ref 26.0–34.0)
MCHC: 33.9 g/dL (ref 30.0–36.0)
MCV: 87.8 fL (ref 80.0–100.0)
Monocytes Absolute: 0.4 10*3/uL (ref 0.1–1.0)
Monocytes Relative: 5 %
Neutro Abs: 6.1 10*3/uL (ref 1.7–7.7)
Neutrophils Relative %: 70 %
Platelets: 270 10*3/uL (ref 150–400)
RBC: 4.33 MIL/uL (ref 3.87–5.11)
RDW: 12.6 % (ref 11.5–15.5)
WBC: 8.6 10*3/uL (ref 4.0–10.5)
nRBC: 0 % (ref 0.0–0.2)

## 2021-09-27 LAB — POC URINE PREG, ED: Preg Test, Ur: POSITIVE — AB

## 2021-09-27 LAB — ABO/RH: ABO/RH(D): O NEG

## 2021-09-27 LAB — ANTIBODY SCREEN: Antibody Screen: NEGATIVE

## 2021-09-27 LAB — HCG, QUANTITATIVE, PREGNANCY: hCG, Beta Chain, Quant, S: 530 m[IU]/mL — ABNORMAL HIGH (ref <5)

## 2021-09-27 MED ORDER — MISOPROSTOL 200 MCG PO TABS
800.0000 ug | ORAL_TABLET | Freq: Once | ORAL | Status: AC
  Administered 2021-09-27: 800 ug via VAGINAL
  Filled 2021-09-27: qty 4

## 2021-09-27 MED ORDER — IBUPROFEN 800 MG PO TABS: 800.0000 mg | ORAL_TABLET | Freq: Three times a day (TID) | ORAL | 0 refills | Status: DC | PRN

## 2021-09-27 MED ORDER — ONDANSETRON 4 MG PO TBDP: 4.0000 mg | ORAL_TABLET | Freq: Three times a day (TID) | ORAL | 0 refills | Status: DC | PRN

## 2021-09-27 MED ORDER — RHO D IMMUNE GLOBULIN 1500 UNIT/2ML IJ SOSY
300.0000 ug | PREFILLED_SYRINGE | Freq: Once | INTRAMUSCULAR | Status: AC
  Administered 2021-09-27: 300 ug via INTRAMUSCULAR
  Filled 2021-09-27 (×2): qty 2

## 2021-09-27 NOTE — Progress Notes (Signed)
   09/27/21 1440  Clinical Encounter Type  Visited With Patient  Visit Type Initial  Referral From Other (Comment) (rounding)  Chaplain Burris checked-in on Pt's well-being. Pt said she was fine or did not express any needs, did not wish to engage at this time.

## 2021-09-27 NOTE — ED Notes (Signed)
Pt tearfully declined DC vitals at this time stating they "just want to get out of here and get home". Pt in stable condition. Aox4, ambulating with a steady gait.

## 2021-09-27 NOTE — ED Provider Notes (Signed)
[facility]  ____________________________________________   Event Date/Time   First MD Initiated Contact with Patient 09/27/21 1149     (approximate)  I have reviewed the triage vital signs and the nursing notes.   HISTORY  Chief Complaint possible ectopic pregnancy    HPI Judith Martinez is a 35 y.o. female with past medical history of GERD, fibromyalgia, Hashimoto's thyroiditis who presents with concern for abnormal pregnancy.  Patient's LMP was September 29, she had a positive pregnancy about 2 weeks ago.  She follows with [facility] and has been having beta-hCG levels drawn which initially declined and have leveled off around 550.  Doctor sent her in today to have an ultrasound to rule out ectopic given she has a history of a tubal ligation that was then reversed and only one of her fallopian tubes is currently patent.  The patient endorses some nausea but denies abdominal pain vaginal bleeding vomiting lightheadedness or dizziness.         Past Medical History:  Diagnosis Date   Celiac disease 02/10/2021   Fibromyalgia    GERD (gastroesophageal reflux disease)    Hashimoto's thyroiditis 02/10/2021   History of kidney stones 2013   History of pyelonephritis 2012   Hx of migraines    Panic attacks 07/30/2018    Patient Active Problem List   Diagnosis Date Noted   Chronic pain syndrome 07/27/2021   Pharmacologic therapy 07/27/2021   Disorder of skeletal system 07/27/2021   Problems influencing health status 07/27/2021   Encounter for chronic pain management 07/27/2021   Headache 06/20/2021   Focal sensory loss 06/20/2021   Hypersomnia 02/11/2021   GERD (gastroesophageal reflux disease)    Hashimoto's thyroiditis 02/10/2021   Celiac disease 02/10/2021   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 Authorizing Provider  ibuprofen (ADVIL) 800 MG tablet Take 1 tablet (800 mg total) by mouth every 8 (eight) hours as needed. 09/27/21  Yes Georga HackingMcHugh, Yolani Vo Rose, MD  ondansetron (ZOFRAN ODT) 4 MG disintegrating tablet Take 1 tablet (4 mg total) by mouth every 8 (eight) hours as needed for nausea or vomiting. 09/27/21  Yes Georga HackingMcHugh, Shanita Kanan Rose, MD  amitriptyline (ELAVIL) 25 MG tablet TAKE 1-2 TABLETS (25-50 MG TOTAL) BY MOUTH AT BEDTIME. 09/07/21   Karie SchwalbeLetvak, Richard I, MD  butalbital-acetaminophen-caffeine (FIORICET) 50-325-40 MG tablet TAKE 1-2 TABLETS BY MOUTH DAILY AS NEEDED FOR HEADACHE. 09/12/21 09/12/22  Karie SchwalbeLetvak, Richard I, MD  ondansetron (ZOFRAN) 4 MG tablet Take 1 tablet (4 mg total) by mouth every 8 (eight) hours as needed for nausea or vomiting. 06/20/21   Karie SchwalbeLetvak, Richard I, MD  pregabalin (LYRICA) 50 MG capsule Take 1 capsule by mouth in the morning and at bedtime. 05/20/21   [provider]  SYNTHROID 100 MCG tablet Take 1 tablet (100 mcg total) by mouth daily before breakfast. 07/28/21   Shamleffer, Konrad DoloresIbtehal Jaralla, MD    Allergies Patient has no known allergies.  Family History  Problem Relation Age of Onset   Hypertension Father  Migraines Father    Hyperlipidemia Father    CAD Paternal Grandfather    Parkinson's disease Paternal Grandfather    Cancer Paternal Grandfather        leukemia   Stroke Paternal Grandfather    Colon polyps Paternal Grandfather    Cancer Sister 4       tumor in leg   Cancer Other        maternal great grandmother; breast   Diabetes Paternal Grandmother    Colon polyps Paternal Grandmother    Fibromyalgia Mother    Colon cancer Maternal Grandfather     Thyroid cancer Neg Hx    Pancreatic cancer Neg Hx    Esophageal cancer Neg Hx     Social History Social History   Tobacco Use   Smoking status: Never   Smokeless tobacco: Never  Vaping Use   Vaping Use: Never used  Substance Use Topics   Alcohol use: Yes    Comment: Occasional   Drug use: No    Review of Systems   Review of Systems  Constitutional:  Negative for chills and fever.  Respiratory:  Negative for shortness of breath.   Cardiovascular:  Negative for chest pain.  Gastrointestinal:  Negative for abdominal pain, nausea and vomiting.  Genitourinary:  Negative for dysuria and vaginal bleeding.  All other systems reviewed and are negative.  Physical Exam Updated Vital Signs BP 125/74   Pulse 79   Temp 98.6 F (37 C) (Oral)   Resp 18   Ht 5\' 6"  (1.676 m)   Wt 70.3 kg   LMP 02/06/2021 Comment: Recent miscarriage   SpO2 99%   BMI 25.02 kg/m   Physical Exam Vitals and nursing note reviewed.  Constitutional:      General: She is not in acute distress.    Appearance: Normal appearance.  HENT:     Head: Normocephalic and atraumatic.  Eyes:     General: No scleral icterus.    Conjunctiva/sclera: Conjunctivae normal.  Pulmonary:     Effort: Pulmonary effort is normal. No respiratory distress.     Breath sounds: No stridor.  Abdominal:     General: Abdomen is flat. There is no distension.     Palpations: Abdomen is soft.     Tenderness: There is no abdominal tenderness. There is no guarding.  Musculoskeletal:        General: No deformity or signs of injury.     Cervical back: Normal range of motion.  Skin:    General: Skin is dry.     Coloration: Skin is not jaundiced or pale.  Neurological:     General: No focal deficit present.     Mental Status: She is alert and oriented to person, place, and time. Mental status is at baseline.  Psychiatric:        Mood and Affect: Mood normal.        Behavior: Behavior normal.     LABS (all labs ordered are  listed, but only abnormal results are displayed)  Labs Reviewed  HCG, QUANTITATIVE, PREGNANCY - Abnormal; Notable for the following components:      Result Value   hCG, Beta Chain, Quant, S 530 (*)    All other components within normal limits  POC URINE PREG, ED - Abnormal; Notable for the following components:   Preg Test, Ur POSITIVE (*)    All other components within normal limits  CBC WITH DIFFERENTIAL/PLATELET  ABO/RH  TYPE AND SCREEN   ____________________________________________  EKG  N/a ____________________________________________  RADIOLOGY Ky Barban, personally viewed and evaluated these images (plain radiographs) as part of my medical decision making, as well as reviewing the written report by the radiologist.  ED MD interpretation: I reviewed the first trimester pregnancy ultrasound which shows a gestational sac but no yolk sac or fetal pole    ____________________________________________   PROCEDURES  Procedure(s) performed (including Critical Care):  Procedures   ____________________________________________   INITIAL IMPRESSION / ASSESSMENT AND PLAN / ED COURSE   Patient is a 35 year old female who presents with concern for pregnancy of abnormal location.  Patient is about [redacted] weeks pregnant by last menstrual period and has been having beta levels that are falling and have now leveled out around 550 during her outpatient test.  Today her beta is 530.  She has no significant abdominal pain she is not having vaginal bleeding.  On exam she appears well she is hemodynamically stable, and her hemoglobin is normal.  First trimester ultrasound shows a gestational sac but there is no yolk sac.  Differential including missed abortion, ectopic pregnancy with decidual reaction or very early intrauterine pregnancy.  Patient is interested in medical therapy as she does not want to continue to have to monitor the beta levels.  Spoke with Dr. Dalbert Garnet with GYN who  provided recommendations regarding misoprostol.  Patient will take 800 mcg per vagina at home and we will also write for 800 mg of ibuprofen and Zofran.  Patient will follow-up in 72 hours to see if it is taking effect.  Discussed return precautions for bleeding.  Also advised the patient that she will need to have a repeat ultrasound sometimes within 2 weeks.  Patient was comfortable with this plan and will call her GYN or follow-up with Dr. Dalbert Garnet.  RhoGAM was administered prior to discharge.      ____________________________________________   FINAL CLINICAL IMPRESSION(S) / ED DIAGNOSES  Final diagnoses:  Missed abortion     ED Discharge Orders          Ordered    ibuprofen (ADVIL) 800 MG tablet  Every 8 hours PRN        09/27/21 1539    ondansetron (ZOFRAN ODT) 4 MG disintegrating tablet  Every 8 hours PRN        09/27/21 1539             Note:  This document was prepared using Dragon voice recognition software and may include unintentional dictation errors.    Georga Hacking, MD 09/27/21 1540

## 2021-09-27 NOTE — ED Notes (Signed)
Pt states that she just recently found out that she was pregnant, states that her levels didn't increase from last week to this week, pt states that her dr is concerned that she could be having an ectopic pregnancy, pt denies any vaginal bleeding or pain in her abd, states that she has had a headache and tingling in her arm

## 2021-09-27 NOTE — ED Triage Notes (Signed)
Pt reports sent from [facility] for possible ectopic pregnancy. No vaginal bleeding, no abd pain. At higher risk. +nausea, shoulder pain . States hcg levels have been staying the same 6.[redacted] weeks along High hx of miscarriages

## 2021-09-27 NOTE — Discharge Instructions (Signed)
Your ultrasound is a gestational sac in the uterus but no fetus indicating that you likely have a miscarriage that has not completed.  There is still a small chance that you have an ectopic pregnancy.  Your beta hCG level today was 530.  Please insert the nasal Postel into the vagina and this will then be absorbed on its own to assist with the abortion.  You can take 800 mg of ibuprofen every 6 hours for pain and cramping.  He should expect significant bleeding but if you are changing her pad more than once every 2 hours, please return to the emergency department.  We usually wait about 72 hours to see if the misoprostol has of had a effect and if it has not you will likely need to have a second dose.  You should have an ultrasound within the next 2 weeks to confirm that the miscarriage has completed.  Please either follow-up with your OB/GYN or Dr. Dalbert Garnet within the next 3 days.

## 2021-09-27 NOTE — ED Notes (Signed)
Pharmacy sent a message to send medications at this time.

## 2021-09-28 ENCOUNTER — Telehealth: Payer: Self-pay

## 2021-09-28 ENCOUNTER — Other Ambulatory Visit: Payer: Self-pay

## 2021-09-28 DIAGNOSIS — O039 Complete or unspecified spontaneous abortion without complication: Secondary | ICD-10-CM

## 2021-09-28 LAB — RHOGAM INJECTION: Unit division: 0

## 2021-09-28 NOTE — Telephone Encounter (Signed)
Patient is scheduled for 10/12/21 with [facility] in [facility]

## 2021-09-28 NOTE — Telephone Encounter (Signed)
Left message to follow-up with her on the recent ER visit. If she calls back, please note here. Thanks.

## 2021-09-28 NOTE — Telephone Encounter (Signed)
TC to patient regarding Rx request. Pt called requesting refill on Cytotec Rx  Pt notes nothing has dissolved after 24hrs , no pain, no bleeding consulted w/ provider pt can wait up to 48 hrs for Rx to work. Pt advised to contact Dr. Dalbert Garnet 's office for F/U and refill request since we have not established care with pt. Pt was offered a virtual visit with a provider if she wanted.  Pt voiced understanding.

## 2021-09-28 NOTE — Telephone Encounter (Signed)
Transition Care Management Follow-up Telephone Call Date of discharge and from where: 09/27/2021-[facility] How have you been since you were released from the hospital? Patient stated she is doing fine.  Any questions or concerns? No  Items Reviewed: Did the pt receive and understand the discharge instructions provided? Yes  Medications obtained and verified? Yes  Other? No  Any new allergies since your discharge? No  Dietary orders reviewed? No Do you have support at home? Yes   Home Care and Equipment/Supplies: Were home health services ordered? not applicable If so, what is the name of the agency? N/A  Has the agency set up a time to come to the patient's home? not applicable Were any new equipment or medical supplies ordered?  No What is the name of the medical supply agency? N/A Were you able to get the supplies/equipment? not applicable Do you have any questions related to the use of the equipment or supplies? No  Functional Questionnaire: (I = Independent and D = Dependent) ADLs: I  Bathing/Dressing- I  Meal Prep- I  Eating- I  Maintaining continence- I  Transferring/Ambulation- I  Managing Meds- I  Follow up appointments reviewed:  PCP Hospital f/u appt confirmed? No   Specialist Hospital f/u appt confirmed? No   Are transportation arrangements needed? No  If their condition worsens, is the pt aware to call PCP or go to the Emergency Dept.? Yes Was the patient provided with contact information for the PCP's office or ED? Yes Was to pt encouraged to call back with questions or concerns? Yes

## 2021-10-07 ENCOUNTER — Other Ambulatory Visit: Payer: Self-pay | Admitting: Internal Medicine

## 2021-10-12 ENCOUNTER — Encounter: Payer: Medicaid Other | Admitting: Licensed Practical Nurse

## 2021-10-12 ENCOUNTER — Encounter: Payer: Medicaid Other | Admitting: Obstetrics and Gynecology

## 2021-10-13 ENCOUNTER — Other Ambulatory Visit: Payer: Self-pay

## 2021-10-13 ENCOUNTER — Other Ambulatory Visit: Payer: Self-pay | Admitting: Internal Medicine

## 2021-10-13 ENCOUNTER — Encounter: Payer: Self-pay | Admitting: Internal Medicine

## 2021-10-13 DIAGNOSIS — E063 Autoimmune thyroiditis: Secondary | ICD-10-CM

## 2021-10-13 DIAGNOSIS — E038 Other specified hypothyroidism: Secondary | ICD-10-CM

## 2021-10-20 ENCOUNTER — Ambulatory Visit: Payer: Medicaid Other | Admitting: Obstetrics and Gynecology

## 2021-11-10 ENCOUNTER — Telehealth (INDEPENDENT_AMBULATORY_CARE_PROVIDER_SITE_OTHER): Payer: Managed Care, Other (non HMO) | Admitting: Family

## 2021-11-10 ENCOUNTER — Encounter: Payer: Self-pay | Admitting: Family

## 2021-11-10 VITALS — Temp 99.0°F | Ht 66.0 in | Wt 155.0 lb

## 2021-11-10 DIAGNOSIS — J069 Acute upper respiratory infection, unspecified: Secondary | ICD-10-CM | POA: Diagnosis not present

## 2021-11-10 DIAGNOSIS — R051 Acute cough: Secondary | ICD-10-CM | POA: Insufficient documentation

## 2021-11-10 DIAGNOSIS — R0602 Shortness of breath: Secondary | ICD-10-CM | POA: Insufficient documentation

## 2021-11-10 MED ORDER — METHYLPREDNISOLONE 4 MG PO TBPK: ORAL_TABLET | ORAL | 0 refills | Status: DC

## 2021-11-10 MED ORDER — ALBUTEROL SULFATE HFA 108 (90 BASE) MCG/ACT IN AERS: 2.0000 | INHALATION_SPRAY | Freq: Four times a day (QID) | RESPIRATORY_TRACT | 0 refills | Status: DC | PRN

## 2021-11-10 MED ORDER — AMOXICILLIN-POT CLAVULANATE 875-125 MG PO TABS: 1.0000 | ORAL_TABLET | Freq: Two times a day (BID) | ORAL | 0 refills | Status: DC

## 2021-11-10 NOTE — Assessment & Plan Note (Signed)
rx for medrol dose pack sent to pt pharmacy.

## 2021-11-10 NOTE — Assessment & Plan Note (Signed)
Rx for Medrol Dosepak as well as albuterol inhaler sent to pharmacy for shortness of breath.  If this is to worsen and/or patient cannot catch her breath advised to go to the emergency room and/or call 911 immediately.
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
## 2021-11-10 NOTE — Assessment & Plan Note (Signed)
Take antibiotic as prescribed. Increase oral fluids. Pt to f/u if sx worsen and or fail to improve in 2-3 days.  

## 2021-11-11 ENCOUNTER — Other Ambulatory Visit: Payer: Self-pay | Admitting: Internal Medicine

## 2021-11-21 ENCOUNTER — Encounter: Payer: Self-pay | Admitting: Internal Medicine

## 2021-12-16 ENCOUNTER — Encounter: Payer: Self-pay | Admitting: Internal Medicine

## 2021-12-16 MED ORDER — VALACYCLOVIR HCL 1 G PO TABS: 2000.0000 mg | ORAL_TABLET | Freq: Once | ORAL | 1 refills | Status: DC

## 2021-12-29 ENCOUNTER — Encounter: Payer: Self-pay | Admitting: Internal Medicine

## 2022-01-12 ENCOUNTER — Other Ambulatory Visit: Payer: Self-pay | Admitting: Internal Medicine

## 2022-01-13 ENCOUNTER — Other Ambulatory Visit: Payer: Self-pay | Admitting: Nurse Practitioner

## 2022-01-13 ENCOUNTER — Encounter: Payer: Self-pay | Admitting: Nurse Practitioner

## 2022-01-13 ENCOUNTER — Ambulatory Visit (INDEPENDENT_AMBULATORY_CARE_PROVIDER_SITE_OTHER): Payer: Managed Care, Other (non HMO) | Admitting: Internal Medicine

## 2022-01-13 ENCOUNTER — Other Ambulatory Visit: Payer: Self-pay

## 2022-01-13 ENCOUNTER — Encounter: Payer: Self-pay | Admitting: Internal Medicine

## 2022-01-13 ENCOUNTER — Ambulatory Visit (INDEPENDENT_AMBULATORY_CARE_PROVIDER_SITE_OTHER): Payer: Managed Care, Other (non HMO) | Admitting: Nurse Practitioner

## 2022-01-13 ENCOUNTER — Ambulatory Visit (INDEPENDENT_AMBULATORY_CARE_PROVIDER_SITE_OTHER)
Admission: RE | Admit: 2022-01-13 | Discharge: 2022-01-13 | Disposition: A | Discharge status: Home / Self Care | Payer: Managed Care, Other (non HMO) | Source: Ambulatory Visit | Attending: Internal Medicine | Admitting: Internal Medicine

## 2022-01-13 VITALS — BP 122/80 | HR 72 | Temp 97.6°F | Ht 65.0 in | Wt 166.0 lb

## 2022-01-13 VITALS — BP 100/80 | HR 72 | Ht 65.5 in | Wt 167.1 lb

## 2022-01-13 DIAGNOSIS — K900 Celiac disease: Secondary | ICD-10-CM

## 2022-01-13 DIAGNOSIS — R1310 Dysphagia, unspecified: Secondary | ICD-10-CM | POA: Diagnosis not present

## 2022-01-13 DIAGNOSIS — M797 Fibromyalgia: Secondary | ICD-10-CM | POA: Diagnosis not present

## 2022-01-13 DIAGNOSIS — K5900 Constipation, unspecified: Secondary | ICD-10-CM

## 2022-01-13 DIAGNOSIS — M25551 Pain in right hip: Secondary | ICD-10-CM

## 2022-01-13 DIAGNOSIS — R110 Nausea: Secondary | ICD-10-CM

## 2022-01-13 DIAGNOSIS — F39 Unspecified mood [affective] disorder: Secondary | ICD-10-CM | POA: Diagnosis not present

## 2022-01-13 DIAGNOSIS — E039 Hypothyroidism, unspecified: Secondary | ICD-10-CM | POA: Diagnosis not present

## 2022-01-13 LAB — COMPREHENSIVE METABOLIC PANEL
ALT: 9 U/L (ref 0–35)
AST: 13 U/L (ref 0–37)
Albumin: 4.8 g/dL (ref 3.5–5.2)
Alkaline Phosphatase: 38 U/L — ABNORMAL LOW (ref 39–117)
BUN: 11 mg/dL (ref 6–23)
CO2: 29 mEq/L (ref 19–32)
Calcium: 10.1 mg/dL (ref 8.4–10.5)
Chloride: 102 mEq/L (ref 96–112)
Creatinine, Ser: 0.88 mg/dL (ref 0.40–1.20)
GFR: 85.64 mL/min (ref >60.00)
Glucose, Bld: 91 mg/dL (ref 70–99)
Potassium: 4.3 mEq/L (ref 3.5–5.1)
Sodium: 139 mEq/L (ref 135–145)
Total Bilirubin: 0.6 mg/dL (ref 0.2–1.2)
Total Protein: 7.5 g/dL (ref 6.0–8.3)

## 2022-01-13 LAB — CBC
HCT: 41 % (ref 36.0–46.0)
Hemoglobin: 13.6 g/dL (ref 12.0–15.0)
MCHC: 33.2 g/dL (ref 30.0–36.0)
MCV: 88.2 fl (ref 78.0–100.0)
Platelets: 313 10*3/uL (ref 150.0–400.0)
RBC: 4.66 Mil/uL (ref 3.87–5.11)
RDW: 14.2 % (ref 11.5–15.5)
WBC: 7.9 10*3/uL (ref 4.0–10.5)

## 2022-01-13 LAB — T4, FREE: Free T4: 1.09 ng/dL (ref 0.60–1.60)

## 2022-01-13 LAB — TSH: TSH: 0.88 u[IU]/mL (ref 0.35–5.50)

## 2022-01-13 LAB — VITAMIN B12: Vitamin B-12: 823 pg/mL (ref 211–911)

## 2022-01-13 MED ORDER — TRULANCE 3 MG PO TABS: 3.0000 mg | ORAL_TABLET | Freq: Every day | ORAL | 1 refills | Status: DC

## 2022-01-13 MED ORDER — DULOXETINE HCL 30 MG PO CPEP: 30.0000 mg | ORAL_CAPSULE | Freq: Every day | ORAL | 3 refills | Status: DC
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
## 2022-01-13 NOTE — Progress Notes (Signed)
RADIOLOGY SCHEDULING REQUEST FOR Barium swallow test ?[facility] Scheduling via secure staff message. ? ? ? ?

## 2022-01-13 NOTE — Assessment & Plan Note (Signed)
Location of the pain suggests the hip joint itself ?Will check x-ray ?

## 2022-01-13 NOTE — Patient Instructions (Signed)
You will be contacted by [facility] (Your caller ID will indicate phone # [phone]) in the next 7 days to schedule your Barium Swallow Test. If you have not heard from them within 7 business days, please call [facility] at [phone] to follow up on the status of your appointment.   ? ?We have sent the following medication to your pharmacy for you to pick up at your convenience: ? ?Trulance 3 MG tablet, take once daily. ? ?Try ginger chews/ginger tea for nausea. ? ?Thank you for trusting me with your gastrointestinal care!   ? ?Noralyn Pick, CRNP ? ? ? ?BMI: ? ?If you are age 44 or older, your body mass index should be between 23-30. Your Body mass index is 27.39 kg/m?Marland Kitchen If this is out of the aforementioned range listed, please consider follow up with your Primary Care Provider. ? ?If you are age 12 or younger, your body mass index should be between 19-25. Your Body mass index is 27.39 kg/m?Marland Kitchen If this is out of the aformentioned range listed, please consider follow up with your Primary Care Provider.  ? ?MY CHART: ? ?The [facility] providers would like to encourage you to use [facility] to communicate with providers for non-urgent requests or questions.  Due to long hold times on the telephone, sending your provider a message by [facility] may be a faster and more efficient way to get a response.  Please allow 48 business hours for a response.  Please remember that this is for non-urgent requests.  ? ? ?   ?  ? ? ?

## 2022-01-13 NOTE — Progress Notes (Signed)
Reviewed and agree with management plans. ? ?Dwan Hemmelgarn L. Daniesha Driver, MD, MPH  ?

## 2022-01-13 NOTE — Assessment & Plan Note (Signed)
This is intertwined with her mood ?Will try the duloxetine to see if that will help ?

## 2022-01-13 NOTE — Assessment & Plan Note (Signed)
Will check labs just in case ?

## 2022-01-13 NOTE — Assessment & Plan Note (Signed)
Has dysthymia (not MDD) ?Overwhelmed with 2 jobs, kids, etc ?Discussed stopping the gym business ? ?Will try duloxetine 30mg  daily ?

## 2022-01-13 NOTE — Progress Notes (Signed)
? ? ? ?01/13/2022 ?Anela R Rappaport ?[id number] ?1986-06-04 ? ? ?Chief Complaint: Difficulty swallowing, constipation.  ? ?History of Present Illness: Judith Martinez is a 36 year old female with a past medical history of  pyelonephritis, migraine headaches, Hashimoto's thyroiditis, chronic constipation and celiac disease diagnosed per EGD 01/10/2021.  She is followed by Dr. Tarri Glenn.  She presents today with complaints of dysphagia, nausea, vomiting and persistent constipation.  She describes having difficulty swallowing foods such as rice or chicken and sometimes she has difficulty swallowing her own saliva.  She feels as if food gets stuck in her throat/upper esophagus which occurs a few times weekly for the past few months.  She has nausea and vomited 3-4 times over the past week.  Emesis was described as partially digested food.  No coffee-ground emesis or frank red hematemesis.  She describes feeling full easily.  Her voice has been raspy for the past year.  She saw ENT about 1 year ago who prescribed Pantoprazole 40 mg daily for suspected laryngeal reflux.  She was prescribed Amoxicillin 2 to 3 weeks ago for a sore throat and cough.  She remains on a gluten-free diet to the best of her ability.  No weight loss.  She drinks 1 cup of coffee daily.  No alcohol use.  She continues to suffer from constipation, can go 3 to 4 days without passing a bowel movement.  She has used Linzess 290 mcg daily, Amitiza 24 mg p.o. twice daily, Dulcolax, milk of magnesia, Prune lax, smooth move tea but remains constipated.  She takes magnesium supplement 2 tabs daily.  She went several days without passing a bowel movement and took prune lax yesterday which resulted in diarrhea.  No rectal bleeding. ? ?EGD 01/10/2021: ?- Normal esophagus. Biopsied. ?- Gastritis. Biopsied. ?- Normal examined duodenum. Biopsied. ?- The examination was otherwise normal ?  ?Colonoscopy 01/10/2021: ?- Preparation of the colon was unsatisfactory. ?- Non-bleeding  external and internal hemorrhoids. ?- Stool in the entire examined colon limiting an evaluation for small and medium-sized lesions. ?- The examination was otherwise normal on direct and retroflexion views. ?Biopsy Report: ?1. Surgical [P], duodenum ?- DUODENAL MUCOSA WITH INTRAEPITHELIAL LYMPHOCYTOSIS AND MILD VILLOUS ARCHITECTURAL CHANGES. ?SEE NOTE ?2. Surgical [P], fundus, gastric antrum and gastric body ?- GASTRIC ANTRAL AND OXYNTIC MUCOSA WITH NO SPECIFIC HISTOPATHOLOGIC CHANGES ?- WARTHIN STARRY STAIN IS NEGATIVE FOR HELICOBACTER PYLORI ?3. Surgical [P], distal esophagus ?- ESOPHAGEAL SQUAMOUS AND CARDIAC MUCOSA WITH REFLUX-ASSOCIATED CHANGES ?- NEGATIVE FOR INTESTINAL METAPLASIA OR DYSPLASIA ?4. Surgical [P], mid and proximal esophagus ?- ESOPHAGEAL SQUAMOUS MUCOSA WITH MILD VASCULAR CONGESTION, AND FOCAL SQUAMOUS BALLOONING, ?SUGGESTIVE OF REFLUX ESOPHAGITIS ?- NEGATIVE FOR INCREASED INTRAEPITHELIAL EOSINOPHILS ?Diagnosis Note ?1. The histologic findings appear compatible with celiac disease in an appropriate clinical setting. Differential diagnosis ?can include tropical sprue, food allergy, immune deficiencies (e.g. IgA deficiency, common variable ?immunodeficiency), viral enteritis, Giardiasis, blind loop syndrome, Crohn disease, autoimmune enteropathy and ?drugs (e.g. NSAIDs). ?  ?Current Outpatient Medications on File Prior to Visit  ?Medication Sig Dispense Refill  ? albuterol (VENTOLIN HFA) 108 (90 Base) MCG/ACT inhaler Inhale 2 puffs into the lungs every 6 (six) hours as needed for wheezing or shortness of breath. 8 g 0  ? pantoprazole (PROTONIX) 40 MG tablet SMARTSIG:1 Tablet(s) By Mouth Every Evening    ? SYNTHROID 100 MCG tablet TAKE 1 TABLET BY MOUTH DAILY BEFORE BREAKFAST. 90 tablet 1  ? ?No current facility-administered medications on file prior to visit.  ? ?No Known Allergies ? ?Current  Medications, Allergies, Past Medical History, Past Surgical History, Family History and Social History were  reviewed in [employer] record. ? ?Review of Systems:   ?Constitutional: Negative for fever, sweats, chills or weight loss.  ?Respiratory: Negative for shortness of breath.   ?Cardiovascular: Negative for chest pain, palpitations and leg swelling.  ?Gastrointestinal: See HPI.  ?Musculoskeletal: Negative for back pain or muscle aches.  ?Neurological: Negative for dizziness, headaches or paresthesias.  ? ?Physical Exam: ?BP 100/80 (BP Location: Right Arm, Patient Position: Sitting, Cuff Size: Normal)   Pulse 72   Ht 5' 5.5" (1.664 m) Comment: height measured without shoes  Wt 167 lb 2 oz (75.8 kg)   LMP 01/13/2022   BMI 27.39 kg/m?  ? ?General: 36 year old female in no acute distress ?Head: Normocephalic and atraumatic. ?Eyes: No scleral icterus. Conjunctiva pink . ?Ears: Normal auditory acuity. ?Lungs: Clear throughout to auscultation. ?Heart: Regular rate and rhythm, no murmur. ?Abdomen: Soft, nontender.  Mild generalized tenderness without rebound or guarding.  No masses or hepatomegaly. Normal bowel sounds x 4 quadrants.  ?Rectal: Deferred.  ?Musculoskeletal: Symmetrical with no gross deformities. ?Extremities: No edema. ?Neurological: Alert oriented x 4. No focal deficits.  ?Psychological: Alert and cooperative. Normal mood and affect ? ?Assessment and Recommendations: ? ?45) 36 year old female dysphagia. EGD 12/2020 showed evidence of reflux to the distal esophagus.  No evidence of esophageal stricture/stenosis. ?-Barium swallow with tablet ?-Repeat EGD with esophageal dilatation if dysphagia persists or worsens ?-Avoid foods such as bread, large pieces of meat and rice ? ?2) Celiac disease ?-Continue gluten-free diet ? ?3) N/V, early satiety.  Chronic constipation may be exacerbating the symptoms. ?-Possible gastroparesis component, eventual gastric emptying study ?-Continue Zofran as needed, patient has supply ?-Ginger chews/ginger tea ?-CTAP if nausea/vomiting persists or  worsens ? ?4) Chronic constipation.  Prior constipation medications i.e. Linzess, Amitiza, Dulcolax and smooth move tea initially resulted in improved stool output but eventually became ineffective.  Constipation did not improve on a gluten-free diet. ?-Trulance 3 mg 1 p.o. daily ? ?

## 2022-01-16 ENCOUNTER — Encounter: Payer: Self-pay | Admitting: Internal Medicine

## 2022-01-16 DIAGNOSIS — M25559 Pain in unspecified hip: Secondary | ICD-10-CM

## 2022-01-16 NOTE — Telephone Encounter (Signed)
MyChart message sent back to pt with Padonda's message. ?

## 2022-01-26 ENCOUNTER — Ambulatory Visit (INDEPENDENT_AMBULATORY_CARE_PROVIDER_SITE_OTHER): Payer: Managed Care, Other (non HMO) | Admitting: Internal Medicine

## 2022-01-26 ENCOUNTER — Other Ambulatory Visit: Payer: Self-pay

## 2022-01-26 ENCOUNTER — Encounter: Payer: Self-pay | Admitting: Internal Medicine

## 2022-01-26 VITALS — BP 110/70 | HR 73 | Ht 65.5 in | Wt 164.8 lb

## 2022-01-26 DIAGNOSIS — E038 Other specified hypothyroidism: Secondary | ICD-10-CM | POA: Diagnosis not present

## 2022-01-26 DIAGNOSIS — E063 Autoimmune thyroiditis: Secondary | ICD-10-CM | POA: Diagnosis not present

## 2022-01-26 NOTE — Progress Notes (Signed)
? ?Name: Judith Martinez  ?MRN/ DOB: [id number], 03/25/1986    ?Age/ Sex: 36 y.o., female   ? ? ?PCP: Venia Carbon, MD   ?Reason for Endocrinology Evaluation: Hypothyroidism  ?   ?Initial Endocrinology Clinic Visit: 07/26/2020  ? ? ?PATIENT IDENTIFIER: Judith Martinez is a 36 y.o., female with a past medical history of Hashimoto's thyroiditis, celiac disease, Fatty liver  And fibromyalgia.  She has followed with [facility] since 07/26/2020 for consultative assistance with management of her Hypothyroidism.  ? ?HISTORICAL SUMMARY:  ?She was diagnosed with Hashimoto's Thyroiditis 06/2020. Started on Lt-4 replacement . She was tired at the time as well as lightheadedness as well as weight gain. TSH was high as 4  .  ? ? ?Thyroid Ultrasound 07/2020 non- revealing  ? ?No FH of thyroid disease  ? ? ?Follows with rheumatology for fibromyalgia, on Naltrexone and Gabapentin  ? ? ?She states 12 " chemical pregnancies" but no viable and is under the impression that  Her thyroid has to do with it, highest TSH per was was 4.0 uIU/mL prior to initiating LT- 4 replacement  ?Pt has 5 kids prior to her current marriage, and has been trying to conceive for yrs since her marriage without success. ?She had a tubal ligation reversal in 2015  ? ?Diagnosed with celiac disease  through biopsy 01/2022 ? ? ?SUBJECTIVE:  ? ? ?Today (01/26/2022):  Judith Martinez is here for a follow up on Hypothyroidism.  ? ?She was seen by GI with a celiac disease diagnosis.  ?Treated for URI in 10/2021 ?She had missed abortion 09/2021 ? ?She follows with infertility clinical virtually, at this time she is taking a break from conception  ? ?Has been noted with weight gain  ?She continues with voice hoarseness and  odynophagia  ?Right foot pain  ?Scheduled for Barium swallow  ? ?LMP has been irregular since miscarriage in 09/2021 ? ? ? ? ? ? ? ? ? ? ?Medication: ? ?Synthroid 75 mcg , 2 tabs on Sundays and 1 tablet daily  ? ? ?HISTORY:  ?Past Medical  History:  ?Past Medical History:  ?Diagnosis Date  ? Celiac disease 02/10/2021  ? Fibromyalgia   ? GERD (gastroesophageal reflux disease)   ? Hashimoto's thyroiditis 02/10/2021  ? History of kidney stones 2013  ? History of pyelonephritis 2012  ? Hx of migraines   ? Panic attacks 07/30/2018  ? ?Past Surgical History:  ?Past Surgical History:  ?Procedure Laterality Date  ? BREAST ENHANCEMENT SURGERY  2012  ? BREAST IMPLANT REMOVAL  2021  ? TONSILLECTOMY  1994  ? TUBAL LIGATION  10/13  ? tubal reversal  2016  ? ?Social History:  reports that she has never smoked. She has been exposed to tobacco smoke. She has never used smokeless tobacco. She reports current alcohol use. She reports that she does not use drugs. ?Family History:  ?Family History  ?Problem Relation Age of Onset  ? Hypertension Father   ? Migraines Father   ? Hyperlipidemia Father   ? CAD Paternal Grandfather   ? Parkinson's disease Paternal Grandfather   ? Cancer Paternal Grandfather   ?     leukemia  ? Stroke Paternal Grandfather   ? Colon polyps Paternal Grandfather   ? Cancer Sister 4  ?     tumor in leg  ? Cancer Other   ?     maternal great grandmother; breast  ? Diabetes Paternal Grandmother   ? Colon  polyps Paternal Grandmother   ? Fibromyalgia Mother   ? Colon cancer Maternal Grandfather   ? Thyroid cancer Neg Hx   ? Pancreatic cancer Neg Hx   ? Esophageal cancer Neg Hx   ? ? ? ?HOME MEDICATIONS: ?Allergies as of 01/26/2022   ?No Known Allergies ?  ? ?  ?Medication List  ?  ? ?  ? Accurate as of January 26, 2022  8:49 AM. If you have any questions, ask your nurse or doctor.  ?  ?  ? ?  ? ?albuterol 108 (90 Base) MCG/ACT inhaler ?Commonly known as: VENTOLIN HFA ?Inhale 2 puffs into the lungs every 6 (six) hours as needed for wheezing or shortness of breath. ?  ?DULoxetine 30 MG capsule ?Commonly known as: CYMBALTA ?Take 1 capsule (30 mg total) by mouth daily. ?  ?pantoprazole 40 MG tablet ?Commonly known as: PROTONIX ?SMARTSIG:1 Tablet(s) By Mouth  Every Evening ?  ?Synthroid 100 MCG tablet ?Generic drug: levothyroxine ?TAKE 1 TABLET BY MOUTH DAILY BEFORE BREAKFAST. ?  ?Trulance 3 MG Tabs ?Generic drug: Plecanatide ?TAKE 1 TABLET BY MOUTH EVERY DAY ?  ? ?  ? ? ? ? ?OBJECTIVE:  ? ?PHYSICAL EXAM: ?VS: BP 110/70 (BP Location: Left Arm, Patient Position: Sitting, Cuff Size: Small)   Pulse 73   Ht 5' 5.5" (1.664 m)   Wt 164 lb 12.8 oz (74.8 kg)   LMP 01/13/2022   SpO2 99%   BMI 27.01 kg/m?   ? ?EXAM: ?General: Pt tearful   ?Neck: General: Supple without adenopathy. ?Thyroid: Thyroid size normal.  No goiter or nodules appreciated. No thyroid bruit.  ?Lungs: Clear with good BS bilat with no rales, rhonchi, or wheezes  ?Heart: Auscultation: RRR.  ?Abdomen: Normoactive bowel sounds, soft, nontender, without masses or organomegaly palpable  ?Extremities:  ?BL LE: No pretibial edema normal ROM and strength.  ?Mental Status: Judgment, insight: Intact ?Memory: Intact for recent and remote events ?Mood and affect: No depression, anxiety, or agitation  ? ? ? ?DATA REVIEWED: ? ? Latest Reference Range & Units 01/26/22 09:01  ?TSH 0.450 - 4.500 uIU/mL 1.210  ?Triiodothyronine,Free,Serum 2.0 - 4.4 pg/mL 2.5  ?T4,Free(Direct) 0.82 - 1.77 ng/dL 1.85 (H)  ?(H): Data is abnormally high ? ?Results for AISHIA, DEVICO (MRN [id number]) as of 02/02/2021 13:03 ? Ref. Range 08/19/2020 14:33  ?Thyroperoxidase Ab SerPl-aCnc Latest Ref Range: 0 - 34 IU/mL 240 (H)  ? ? ? ?Thyroid ultrasound 07/2020 ?Markedly heterogeneous and potentially hyperemic thyroid without ?discrete nodule or mass. Findings are nonspecific though compatible ?with provided history of Hashimoto's thyroiditis ? ?ASSESSMENT / PLAN / RECOMMENDATIONS:  ? ?Hashimoto's Thyroiditis: ? ? ?- Pt with fatigue  ?- She takes LT- 4 replacement correctly.  ?-TSH continues to fluctuate and given the presence of celiac disease, I have recommended switching  to Tirosint  ?- Pt with local neck symptoms will repeat, of note last thyroid  US 2021 was unremarkable  ? ?Medications  ? ?Synthroid 100 MCG daily ?Start Tirosint 88 mcg daily  ? ? ? ? ?F/U in 6 months  ?Labs in 8 weeks ? ? ?Signed electronically by: ?Abby Nena Jordan, MD ? ?[facility]  ?[facility] ?[address] ?[place], [place] [address] ?Phone: [phone] ?[phone]  ? ? ? ? ?CC: ?Venia Carbon, MD ?[place] ?[place] [address] ?Phone: [phone]  ?Fax: [phone] ? ? ?Return to Endocrinology clinic as below: ?Future Appointments  ?Date Time Provider Department  Center  ?01/27/2022  9:30 AM [facility] R/F 1 [facility] [place]  ?  ? ?

## 2022-01-26 NOTE — Patient Instructions (Addendum)
-   Will switch Synthroid to Tirosint after your results  ? ? ?Repeat Labs in 8 weeks  ?

## 2022-01-27 ENCOUNTER — Ambulatory Visit (HOSPITAL_COMMUNITY)
Admission: RE | Admit: 2022-01-27 | Discharge: 2022-01-27 | Disposition: A | Discharge status: Home / Self Care | Payer: Managed Care, Other (non HMO) | Source: Ambulatory Visit | Attending: Nurse Practitioner | Admitting: Nurse Practitioner

## 2022-01-27 ENCOUNTER — Telehealth: Payer: Self-pay

## 2022-01-27 ENCOUNTER — Other Ambulatory Visit: Payer: Self-pay

## 2022-01-27 DIAGNOSIS — K900 Celiac disease: Secondary | ICD-10-CM | POA: Diagnosis not present

## 2022-01-27 DIAGNOSIS — R110 Nausea: Secondary | ICD-10-CM

## 2022-01-27 DIAGNOSIS — R1310 Dysphagia, unspecified: Secondary | ICD-10-CM

## 2022-01-27 DIAGNOSIS — R1030 Lower abdominal pain, unspecified: Secondary | ICD-10-CM

## 2022-01-27 DIAGNOSIS — K625 Hemorrhage of anus and rectum: Secondary | ICD-10-CM

## 2022-01-27 DIAGNOSIS — K649 Unspecified hemorrhoids: Secondary | ICD-10-CM

## 2022-01-27 DIAGNOSIS — K5900 Constipation, unspecified: Secondary | ICD-10-CM

## 2022-01-27 DIAGNOSIS — R1011 Right upper quadrant pain: Secondary | ICD-10-CM

## 2022-01-27 LAB — TSH: TSH: 1.21 u[IU]/mL (ref 0.450–4.500)

## 2022-01-27 LAB — T4, FREE: Free T4: 1.85 ng/dL — ABNORMAL HIGH (ref 0.82–1.77)

## 2022-01-27 LAB — T3, FREE: T3, Free: 2.5 pg/mL (ref 2.0–4.4)

## 2022-01-27 MED ORDER — TIROSINT 88 MCG PO CAPS: 88.0000 ug | ORAL_CAPSULE | Freq: Every day | ORAL | 2 refills | Status: DC

## 2022-01-27 NOTE — Telephone Encounter (Signed)
Prior Authorization for Trulance submitted via CoverMyMeds.  ? ?Awaiting response from insurance. ? ?

## 2022-01-27 NOTE — Telephone Encounter (Signed)
Per pt request (see below), referral has been created and records have been faxed to [facility]. A previous referral was placed for [facility]. However, they do not accept pt ins. At that time, pt was advised to call her ins co to determine what GI specialist is covered by her plan.  ? ?Received from pt: ? ?If you don?t feel the study would be useful to rule things out could you refer me to [facility] to see their GI? I don?t know how after all the non working meds, and 10xs dosages this one med may magically make everything better and I doubt it will. I am tire of misery, pain, and fear of food. ?

## 2022-01-31 ENCOUNTER — Other Ambulatory Visit: Payer: Self-pay

## 2022-01-31 ENCOUNTER — Other Ambulatory Visit: Payer: Self-pay | Admitting: Nurse Practitioner

## 2022-01-31 ENCOUNTER — Encounter: Payer: Self-pay | Admitting: Internal Medicine

## 2022-01-31 ENCOUNTER — Telehealth: Payer: Self-pay

## 2022-01-31 ENCOUNTER — Other Ambulatory Visit (HOSPITAL_COMMUNITY): Payer: Self-pay

## 2022-01-31 DIAGNOSIS — K219 Gastro-esophageal reflux disease without esophagitis: Secondary | ICD-10-CM

## 2022-01-31 DIAGNOSIS — R1310 Dysphagia, unspecified: Secondary | ICD-10-CM

## 2022-01-31 MED ORDER — PANTOPRAZOLE SODIUM 40 MG PO TBEC: 40.0000 mg | DELAYED_RELEASE_TABLET | Freq: Two times a day (BID) | ORAL | 2 refills | Status: DC

## 2022-01-31 NOTE — Addendum Note (Signed)
Addended by: Tillman Abide I on: 01/31/2022 12:59 PM ? ? Modules accepted: Orders ? ?

## 2022-01-31 NOTE — Telephone Encounter (Signed)
TIROSINT 88 MCG CAPS needs PA  ?

## 2022-01-31 NOTE — Telephone Encounter (Signed)
Patient Advocate Encounter ?  ?Received notification from patient calls that prior authorization for Tirosint 67mcg caps is required by his/her insurance [employer]. ?  ?PA submitted on 01/31/22 ? ?Key#: [id number] ? ?Status is pending ?   ?[facility] will continue to follow: ? ?Patient Advocate ?Fax: [phone]  ?

## 2022-02-02 ENCOUNTER — Encounter: Payer: Self-pay | Admitting: Internal Medicine

## 2022-02-02 ENCOUNTER — Ambulatory Visit
Admission: RE | Admit: 2022-02-02 | Discharge: 2022-02-02 | Disposition: A | Discharge status: Home / Self Care | Payer: Managed Care, Other (non HMO) | Source: Ambulatory Visit | Attending: Internal Medicine | Admitting: Internal Medicine

## 2022-02-02 DIAGNOSIS — E063 Autoimmune thyroiditis: Secondary | ICD-10-CM

## 2022-02-06 ENCOUNTER — Encounter: Payer: Self-pay | Admitting: Internal Medicine

## 2022-02-06 DIAGNOSIS — E042 Nontoxic multinodular goiter: Secondary | ICD-10-CM

## 2022-02-09 ENCOUNTER — Ambulatory Visit: Payer: Managed Care, Other (non HMO) | Admitting: Orthopaedic Surgery

## 2022-02-14 ENCOUNTER — Encounter: Payer: Self-pay | Admitting: Internal Medicine

## 2022-02-14 NOTE — Telephone Encounter (Signed)
Received a fax regarding Prior Authorization from [facility] for [facility]. Authorization has been DENIED because: ? ?There is no indication that the patient meets either of the following criteria:  ?1. An inability to swallow generic levothyroxine sodium tablet; or  ?2. Has one of the following conditions: a. Congenital hypothyroidism in children 35 years of age or younger; or b. Diagnosis of thyroid cancer; or c.Pregnancy. ?Tirosint (branded generic levothyroxine capsule), generic levothyroxine capsule, and Tirosint-SOL are considered medically necessary when the individual meets one of the following criteria: 1. ?Inability to swallow generic levothyroxine sodium tablet; or 2. Has one of the following conditions: ?a. Congenital hypothyroidism in children 70 years of age or younger; or b. Diagnosis of thyroid ?cancer; or c. Pregnancy. Thyroid hormone supplement products are considered medically ?necessary for continued use when initial criteria are met and documentation of beneficial response. ?For pregnancy, authorization duration is for the pregnancy term. For all other diagnosis, initial ?approval and reauthorization duration is 12 months. ?. ? ?Phone# ? ?

## 2022-02-15 MED ORDER — TIROSINT 88 MCG PO CAPS: 88.0000 ug | ORAL_CAPSULE | Freq: Every day | ORAL | 2 refills | Status: DC

## 2022-02-17 ENCOUNTER — Other Ambulatory Visit: Payer: Self-pay | Admitting: Internal Medicine

## 2022-02-23 ENCOUNTER — Other Ambulatory Visit: Payer: Self-pay

## 2022-02-23 DIAGNOSIS — R6881 Early satiety: Secondary | ICD-10-CM

## 2022-02-23 DIAGNOSIS — R110 Nausea: Secondary | ICD-10-CM

## 2022-02-23 DIAGNOSIS — R112 Nausea with vomiting, unspecified: Secondary | ICD-10-CM

## 2022-02-24 ENCOUNTER — Other Ambulatory Visit: Payer: Self-pay

## 2022-02-24 DIAGNOSIS — R110 Nausea: Secondary | ICD-10-CM

## 2022-02-24 MED ORDER — ONDANSETRON HCL 8 MG PO TABS: ORAL_TABLET | ORAL | 0 refills | Status: DC

## 2022-02-24 NOTE — Telephone Encounter (Signed)
Judith Martinez, ok to send in RX for Ondansetron 8mg  odt, dissolve one tab on top of tongue Q 8 hrs PRN. # 30. No refills. Pls inform patient not to take Ondansetron for 48 hrs prior to her gastric empty study as this medication will alter the result of this study.  ? ?She can take OTC activated charcoal tablets  or capsules for abdominal gas discomfort, one or two tabs/capsules po bid PRN. Thx  ?

## 2022-03-14 ENCOUNTER — Encounter: Payer: Self-pay | Admitting: Internal Medicine

## 2022-03-14 ENCOUNTER — Telehealth: Payer: Self-pay | Admitting: Gastroenterology

## 2022-03-14 NOTE — Telephone Encounter (Signed)
Letter received from [facility] gastroenterology today.  They reviewed the patient's referral and medical records and determined that an additional evaluation with [facility] is very unlikely to alter her diagnoses or management.  They did not offer an appointment at this time. ? ?Please find out if the patient would rather go to [facility] or [facility] and process that referral instead.  Thank you. ?

## 2022-03-15 ENCOUNTER — Encounter: Payer: Self-pay | Admitting: Internal Medicine

## 2022-03-15 ENCOUNTER — Other Ambulatory Visit: Payer: Self-pay | Admitting: Internal Medicine

## 2022-03-15 DIAGNOSIS — E038 Other specified hypothyroidism: Secondary | ICD-10-CM

## 2022-03-15 MED ORDER — ALPRAZOLAM 0.25 MG PO TABS: 0.2500 mg | ORAL_TABLET | Freq: Two times a day (BID) | ORAL | 0 refills | Status: DC | PRN

## 2022-03-16 ENCOUNTER — Other Ambulatory Visit: Payer: Self-pay

## 2022-03-16 DIAGNOSIS — E038 Other specified hypothyroidism: Secondary | ICD-10-CM

## 2022-03-16 NOTE — Telephone Encounter (Signed)
My Chart message sent. Awaiting pt response. ?

## 2022-03-17 LAB — T3: T3, Total: 88 ng/dL (ref 76–181)

## 2022-03-17 LAB — T4, FREE: Free T4: 1.5 ng/dL (ref 0.8–1.8)

## 2022-03-17 LAB — TSH: TSH: 1.09 mIU/L

## 2022-03-23 ENCOUNTER — Other Ambulatory Visit: Payer: Managed Care, Other (non HMO)

## 2022-03-24 ENCOUNTER — Ambulatory Visit (HOSPITAL_COMMUNITY): Admission: RE | Admit: 2022-03-24 | Payer: Managed Care, Other (non HMO) | Source: Ambulatory Visit

## 2022-04-12 ENCOUNTER — Encounter: Payer: Self-pay | Admitting: Internal Medicine

## 2022-04-12 IMAGING — MR MR HEAD W/O CM
10 series · 48 of 48 positions shown · non-contrast
Comparison: None

CLINICAL DATA: Neuro deficit, acute, stroke suspected; progressive
headaches for 6-8 months.

EXAM:
MRI HEAD WITHOUT CONTRAST
TECHNIQUE: Multiplanar, multiecho pulse sequences of the brain and surrounding
structures were obtained without intravenous contrast.

[Series 2: DWI · axial · 3.0mm · 1.20mm/px · z∈[-7,+155]mm · 9 of 109 slices shown (1 of 4)]
[im 1/109]
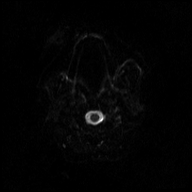
[im 14/109]
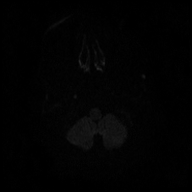
[im 28/109]
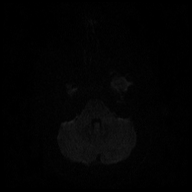
[im 41/109]
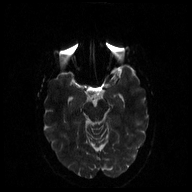
[im 55/109]
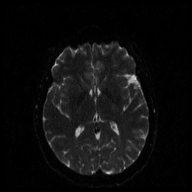
[im 68/109]
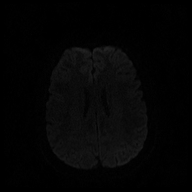
[im 82/109]
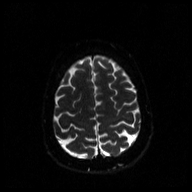
[im 95/109]
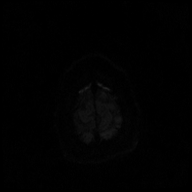
[im 109/109]
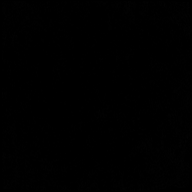

[Series 3: DWI · axial · 3.0mm · 1.20mm/px · z∈[-7,+155]mm · 4 of 55 slices shown (2 of 4)]
[im 1/55]
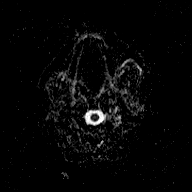
[im 19/55]
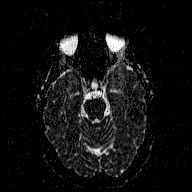
[im 37/55]
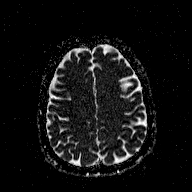
[im 55/55]
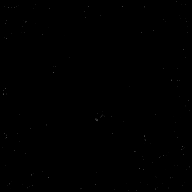

[Series 4: DWI · coronal · 3.0mm · 1.15mm/px · 7 of 96 slices shown (3 of 4)]
[im 1/96]
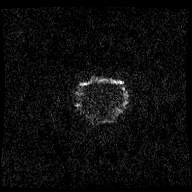
[im 16/96]
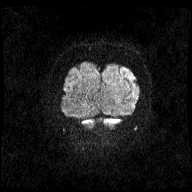
[im 32/96]
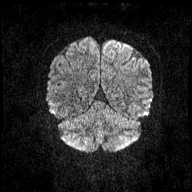
[im 48/96]
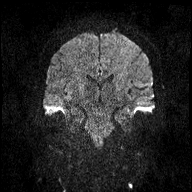
[im 64/96]
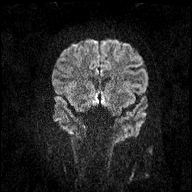
[im 80/96]
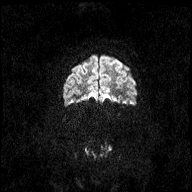
[im 96/96]
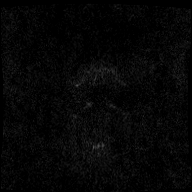

[Series 5: DWI · coronal · 3.0mm · 1.15mm/px · 4 of 49 slices shown (4 of 4)]
[im 1/49]
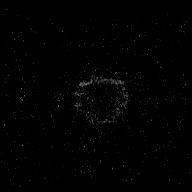
[im 17/49]
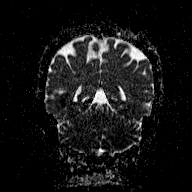
[im 33/49]
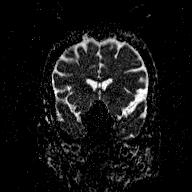
[im 49/49]
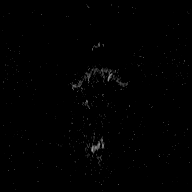

[Series 6: T1 · sagittal · 5.0mm · 0.45mm/px · 2 of 23 slices shown (1 of 2)]
[im 1/23]
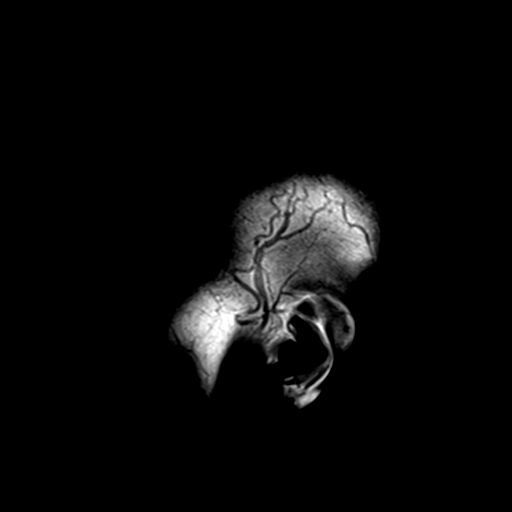
[im 23/23]
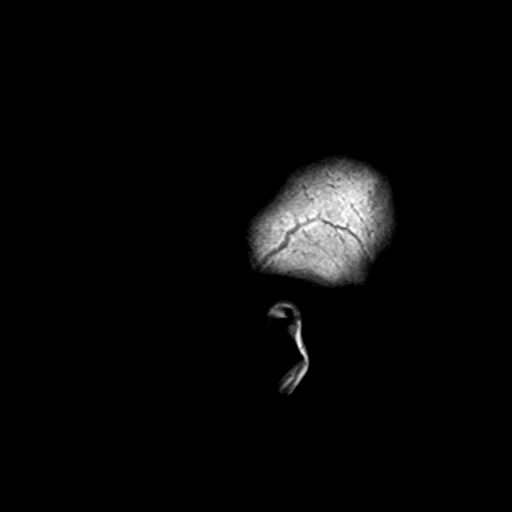

[Series 7: T2 · axial · 5.0mm · 0.72mm/px · z∈[-3,+151]mm · 2 of 23 slices shown (1 of 3)]
[im 1/23]
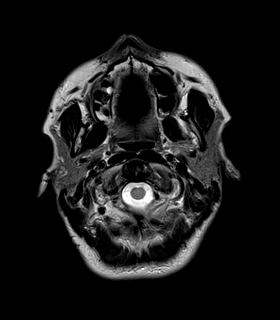
[im 23/23]
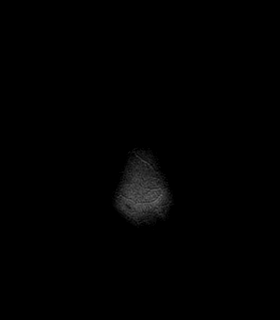

[Series 8: FLAIR · axial · 3.0mm · 0.45mm/px · z∈[-7,+155]mm · 4 of 55 slices shown]
[im 1/55]
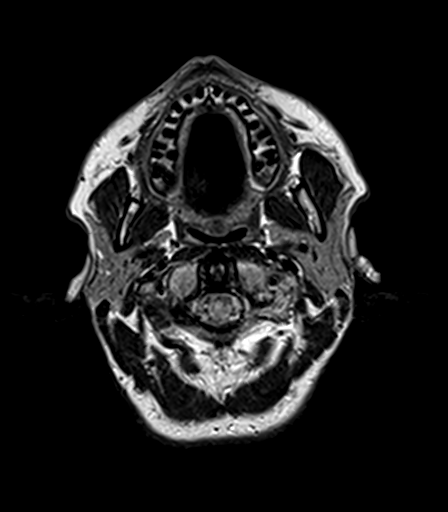
[im 19/55]
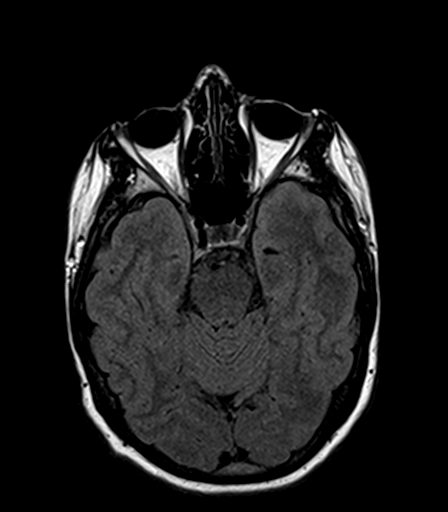
[im 37/55]
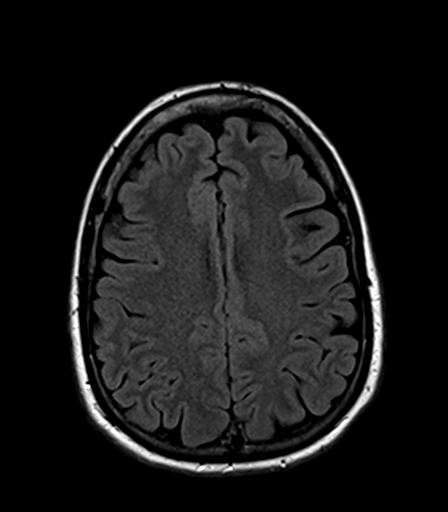
[im 55/55]
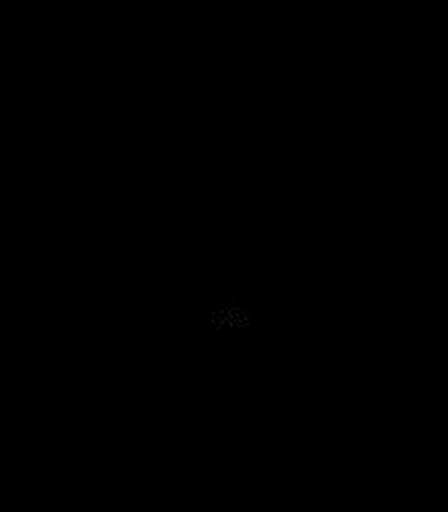

[Series 9: T2 · axial · 5.0mm · 0.72mm/px · z∈[-3,+151]mm · 2 of 23 slices shown (2 of 3)]
[im 1/23]
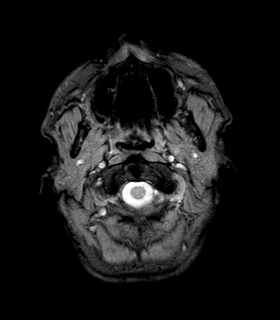
[im 23/23]
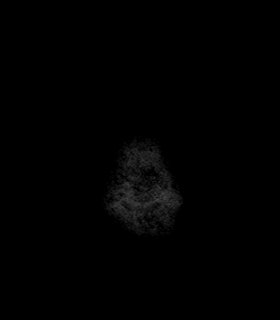

[Series 10: T1 · axial · 1.0mm · 0.94mm/px · z∈[-5,+154]mm · 12 of 160 slices shown (2 of 2)]
[im 1/160]
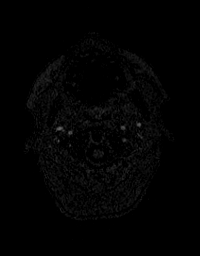
[im 15/160]
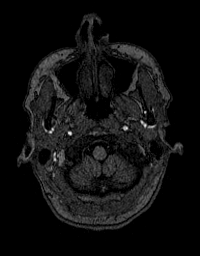
[im 29/160]
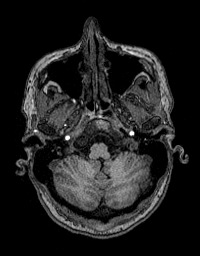
[im 44/160]
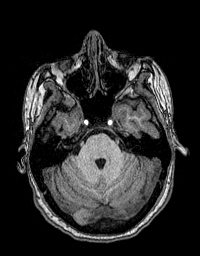
[im 58/160]
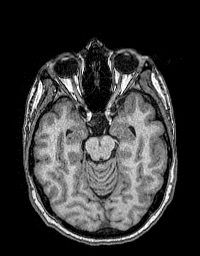
[im 73/160]
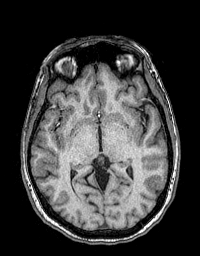
[im 87/160]
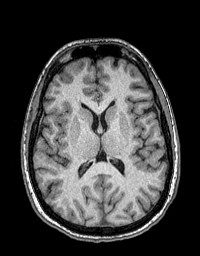
[im 102/160]
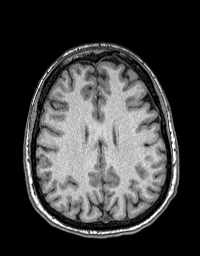
[im 116/160]
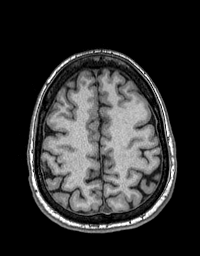
[im 131/160]
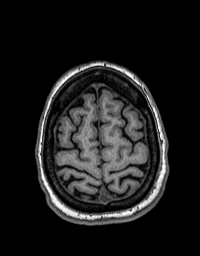
[im 145/160]
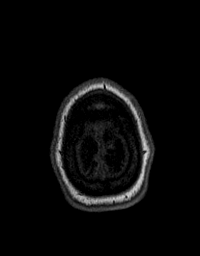
[im 160/160]
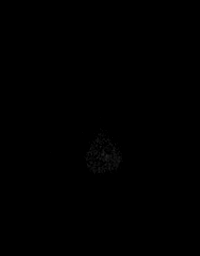

[Series 11: T2 · coronal · 5.0mm · 0.43mm/px · 2 of 29 slices shown (3 of 3)]
[im 1/29]
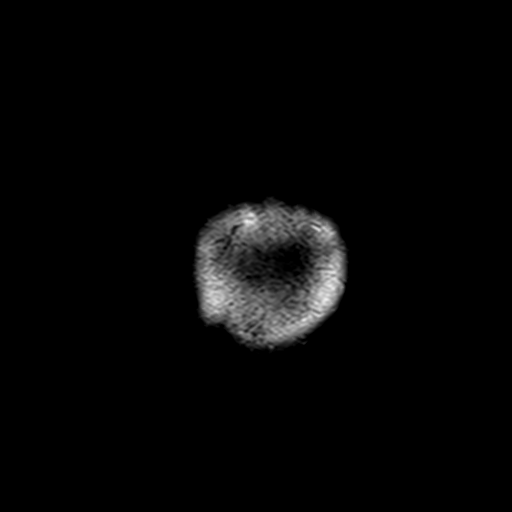
[im 29/29]
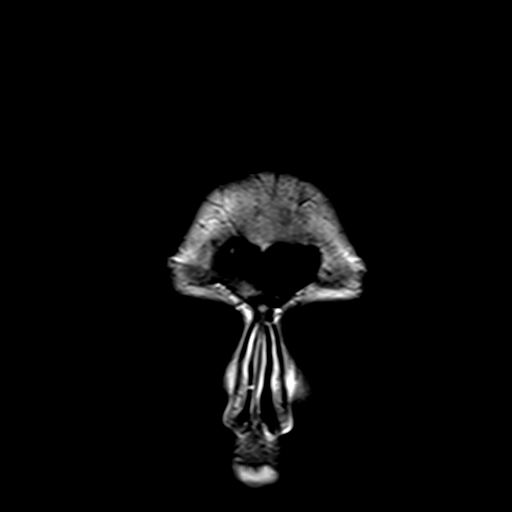

[48 of 48 positions shown; findings below may reference images not displayed]

FINDINGS: Brain: There is no acute infarction or intracranial hemorrhage.
There is no intracranial mass, mass effect, or edema. There is no
hydrocephalus or extra-axial fluid collection. Ventricles and sulci
are normal in size and configuration.

Vascular: Major vessel flow voids at the skull base are preserved.

Skull and upper cervical spine: Normal marrow signal is preserved.

Sinuses/Orbits: Paranasal sinuses are aerated. Orbits are
unremarkable.

Other: Sella is unremarkable.  Mastoid air cells are clear.
IMPRESSION: Normal MRI of the brain.

## 2022-04-14 ENCOUNTER — Encounter: Payer: Self-pay | Admitting: Internal Medicine

## 2022-04-14 MED ORDER — LEVOTHYROXINE SODIUM 100 MCG PO TABS: 100.0000 ug | ORAL_TABLET | Freq: Every day | ORAL | 0 refills | Status: DC

## 2022-04-14 MED ORDER — SYNTHROID 100 MCG PO TABS: 100.0000 ug | ORAL_TABLET | Freq: Every day | ORAL | 0 refills | Status: DC

## 2022-04-15 ENCOUNTER — Other Ambulatory Visit: Payer: Self-pay | Admitting: Internal Medicine

## 2022-04-16 MED ORDER — SYNTHROID 100 MCG PO TABS
100.0000 ug | ORAL_TABLET | Freq: Every day | ORAL | 0 refills | Status: DC
Start: 2022-04-16 — End: 2022-05-11

## 2022-04-17 ENCOUNTER — Other Ambulatory Visit (HOSPITAL_COMMUNITY): Payer: Self-pay

## 2022-04-17 ENCOUNTER — Telehealth: Payer: Self-pay | Admitting: Pharmacy Technician

## 2022-04-17 ENCOUNTER — Other Ambulatory Visit: Payer: Self-pay | Admitting: Internal Medicine

## 2022-04-17 NOTE — Telephone Encounter (Signed)
Last filled 03-15-22 #30 Last OV 01-13-22 No Future OV [facility]

## 2022-04-17 NOTE — Telephone Encounter (Addendum)
Patient Advocate Encounter   Received notification from Pt/Pharmacy that prior authorization for Synthroid is required by his/her insurance [employer].  Per Test Claim: generic covered. Brand not, bc generic available.   PA started on 04/17/22 [facility] PA Case ID: [id number] Status is pending    [facility] will continue to follow:  Patient Advocate Fax:  [phone]

## 2022-04-20 ENCOUNTER — Other Ambulatory Visit (HOSPITAL_COMMUNITY): Payer: Self-pay

## 2022-04-26 ENCOUNTER — Encounter: Payer: Self-pay | Admitting: Internal Medicine

## 2022-05-02 NOTE — Telephone Encounter (Signed)
Patient Advocate Encounter  Received notification from Rosann Auerbach that the request for prior authorization for Synthroid has been denied due to not meeting the necessary criteria the ins requires to approve.  There is no indication that the individual has one of the following: 1. Congenital hypothyroidism in  children 35 years of age or younger; 2. thyroid cancer; 3. Pregnancy; or 4. There is documentation  of both of the following: A. The individual has tried a bioequivalent generic form of the drug; and B.  The individual cannot take the bioequivalent generic form of the drug due to an allergy or bad  reaction to one or more inactive ingredients (dyes, fillers, preservatives) which are not present in  the brand form of the drug.    Specialty Pharmacy Patient Advocate Fax:  [phone]

## 2022-05-04 NOTE — Telephone Encounter (Signed)
Appeal was approved for Synthroid from 04/03/22- 06/01/22  Spoke with pharmacy. Since pt got the generic on 6/6, ins will not allow brand to go through until 6/29 and only a 1 month supply. Pt may be able to get it filled again before the PA runs out, but may not be able to.

## 2022-05-10 ENCOUNTER — Encounter: Payer: Self-pay | Admitting: Internal Medicine

## 2022-05-10 DIAGNOSIS — E038 Other specified hypothyroidism: Secondary | ICD-10-CM

## 2022-05-11 MED ORDER — SYNTHROID 100 MCG PO TABS: 100.0000 ug | ORAL_TABLET | Freq: Every day | ORAL | 3 refills | Status: DC

## 2022-05-11 NOTE — Telephone Encounter (Signed)

## 2022-05-12 ENCOUNTER — Other Ambulatory Visit: Payer: Self-pay

## 2022-05-12 DIAGNOSIS — E038 Other specified hypothyroidism: Secondary | ICD-10-CM

## 2022-05-12 MED ORDER — SYNTHROID 100 MCG PO TABS: 100.0000 ug | ORAL_TABLET | Freq: Every day | ORAL | 3 refills | Status: DC

## 2022-05-13 ENCOUNTER — Encounter (INDEPENDENT_AMBULATORY_CARE_PROVIDER_SITE_OTHER): Payer: Managed Care, Other (non HMO) | Admitting: Internal Medicine

## 2022-05-13 DIAGNOSIS — E038 Other specified hypothyroidism: Secondary | ICD-10-CM

## 2022-05-13 DIAGNOSIS — E063 Autoimmune thyroiditis: Secondary | ICD-10-CM | POA: Diagnosis not present

## 2022-05-13 LAB — T3: T3, Total: 62 ng/dL — ABNORMAL LOW (ref 71–180)

## 2022-05-13 LAB — HCG, SERUM, QUALITATIVE: hCG,Beta Subunit,Qual,Serum: POSITIVE m[IU]/mL — AB (ref <6)

## 2022-05-13 LAB — T4, FREE: Free T4: 1.5 ng/dL (ref 0.82–1.77)

## 2022-05-13 LAB — TSH: TSH: 1.02 u[IU]/mL (ref 0.450–4.500)

## 2022-05-17 NOTE — Telephone Encounter (Signed)

## 2022-05-19 ENCOUNTER — Other Ambulatory Visit (HOSPITAL_COMMUNITY): Payer: Self-pay

## 2022-06-21 ENCOUNTER — Other Ambulatory Visit: Payer: Self-pay | Admitting: Endocrinology

## 2022-06-21 ENCOUNTER — Encounter: Payer: Self-pay | Admitting: Internal Medicine

## 2022-06-21 DIAGNOSIS — E038 Other specified hypothyroidism: Secondary | ICD-10-CM

## 2022-06-22 ENCOUNTER — Encounter: Payer: Self-pay | Admitting: Internal Medicine

## 2022-06-22 DIAGNOSIS — R928 Other abnormal and inconclusive findings on diagnostic imaging of breast: Secondary | ICD-10-CM

## 2022-06-23 ENCOUNTER — Other Ambulatory Visit: Payer: Self-pay | Admitting: Internal Medicine

## 2022-06-23 DIAGNOSIS — N6310 Unspecified lump in the right breast, unspecified quadrant: Secondary | ICD-10-CM

## 2022-06-23 LAB — TSH: TSH: 1.27 u[IU]/mL (ref 0.450–4.500)

## 2022-06-28 ENCOUNTER — Other Ambulatory Visit: Payer: Self-pay | Admitting: Internal Medicine

## 2022-06-28 ENCOUNTER — Encounter: Payer: Self-pay | Admitting: Internal Medicine

## 2022-06-28 DIAGNOSIS — E038 Other specified hypothyroidism: Secondary | ICD-10-CM

## 2022-06-29 ENCOUNTER — Ambulatory Visit
Admission: RE | Admit: 2022-06-29 | Discharge: 2022-06-29 | Disposition: A | Discharge status: Home / Self Care | Payer: Managed Care, Other (non HMO) | Source: Ambulatory Visit | Attending: Internal Medicine | Admitting: Internal Medicine

## 2022-06-29 DIAGNOSIS — N6310 Unspecified lump in the right breast, unspecified quadrant: Secondary | ICD-10-CM

## 2022-06-29 DIAGNOSIS — N6489 Other specified disorders of breast: Secondary | ICD-10-CM | POA: Diagnosis not present

## 2022-06-29 DIAGNOSIS — R928 Other abnormal and inconclusive findings on diagnostic imaging of breast: Secondary | ICD-10-CM | POA: Diagnosis not present

## 2022-06-30 ENCOUNTER — Encounter: Payer: Self-pay | Admitting: Internal Medicine

## 2022-06-30 DIAGNOSIS — Z803 Family history of malignant neoplasm of breast: Secondary | ICD-10-CM

## 2022-06-30 DIAGNOSIS — G5600 Carpal tunnel syndrome, unspecified upper limb: Secondary | ICD-10-CM

## 2022-07-03 ENCOUNTER — Ambulatory Visit
Admission: RE | Admit: 2022-07-03 | Discharge: 2022-07-03 | Disposition: A | Discharge status: Home / Self Care | Payer: Managed Care, Other (non HMO) | Source: Ambulatory Visit | Attending: Internal Medicine | Admitting: Internal Medicine

## 2022-07-03 DIAGNOSIS — E041 Nontoxic single thyroid nodule: Secondary | ICD-10-CM | POA: Diagnosis not present

## 2022-07-03 DIAGNOSIS — E038 Other specified hypothyroidism: Secondary | ICD-10-CM

## 2022-07-04 ENCOUNTER — Other Ambulatory Visit: Payer: Managed Care, Other (non HMO)

## 2022-07-04 ENCOUNTER — Ambulatory Visit (INDEPENDENT_AMBULATORY_CARE_PROVIDER_SITE_OTHER): Payer: Managed Care, Other (non HMO) | Admitting: Sports Medicine

## 2022-07-04 ENCOUNTER — Encounter: Payer: Self-pay | Admitting: Sports Medicine

## 2022-07-04 VITALS — BP 123/73 | HR 64 | Resp 16

## 2022-07-04 DIAGNOSIS — G5601 Carpal tunnel syndrome, right upper limb: Secondary | ICD-10-CM | POA: Diagnosis not present

## 2022-07-04 DIAGNOSIS — G609 Hereditary and idiopathic neuropathy, unspecified: Secondary | ICD-10-CM

## 2022-07-04 DIAGNOSIS — M25511 Pain in right shoulder: Secondary | ICD-10-CM

## 2022-07-04 DIAGNOSIS — M24551 Contracture, right hip: Secondary | ICD-10-CM | POA: Diagnosis not present

## 2022-07-04 DIAGNOSIS — M25551 Pain in right hip: Secondary | ICD-10-CM | POA: Diagnosis not present

## 2022-07-04 NOTE — Patient Instructions (Addendum)
Judith Martinez - It was great to meet you today, thank you for letting me participate in your care!  Today, we discussed your carpal tunnel syndrome and hip pain.  Wear the wrist brace at nighttime --> order a Cock-up wrist brace - can also wear as possible during the day. The key is to reduce repeated flexion/extension of the wrist.  We will get you set up for EMG/nerve studies with Dr. Ernestina Patches  Work with physical therapy on the right hip with hip flexor tightness and hip/leg muscle imbalance  Follow up with me after the nerve studies and about 4 weeks after starting physical therapy.   If you have any further questions, please give the clinic a call [phone]   Cheers,  Dr. Elba Barman, DO

## 2022-07-04 NOTE — Progress Notes (Signed)
Office Visit Note   Patient: Judith Martinez           Date of Birth: 02-10-1986           MRN: [id number] Visit Date: 07/04/2022              Requested by: Venia Carbon, MD [place],  [place] [address] PCP: Venia Carbon, MD  Assessment & Plan: Visit Diagnoses:  1. Carpal tunnel syndrome, right upper limb   2. Hip flexor tightness, right   3. Pain in right hip   4. Idiopathic peripheral neuropathy   5. Trigger point of shoulder region, right     Plan: Had a discussion with Silvia that it does seem she is having worsening of her chronic right carpal tunnel syndrome, now issues with grip and dropping things (reports being diagnosed via nerve studies 2018).  She does have some other numbness/tingling symptoms throughout the right side of the body that I think are unrelated, she does have a history of Hashimoto's thyroiditis and celiac disease that may be playing a role.  I would like her to see Dr. Ernestina Patches for an EMG/NCS to evaluate for her carpal tunnel or other possible RUE neuropathy.  She has failed conservative management with nighttime bracing, anti-inflammatory medications, as well as gabapentin, lyrica, elavil.  We did discuss the option of a carpal tunnel injection today, however she did have a vasovagal episode after a aspiration and injection of the knee, so would like to avoid this today which is reasonable.  Depending on the degree of CTS, may be a candidate for surgical release as she has failed other treatment options.  I did perform 2 trigger point injections into her rhomboid and trapezius of the right medial scapular border, I am hoping this takes care of some of the radiating pain from those trigger points.  In terms of her hip, she has extremely tight hip flexors and weakness with hip abduction.  We will get her set up with physical therapy to work on her lower cross syndrome, improve her hip abduction, as well as potentially using Grasston technique,  dry needling, etc. to calm down her hip flexors.  She will follow-up with me after this to discuss next steps.  Orders:  No orders of the defined types were placed in this encounter.  No orders of the defined types were placed in this encounter.   Procedures:  Procedure: Trigger point injections (2), right medial scapular border After discussion on R/B/I and informed verbal consent was obtained, a timeout was conducted. The patient was placed in a prone position on the examination table and the area of maximal tenderness (2 positions) was identified over the medial border of right scapula. This area was cleansed with Betadine and multiple alcohol swabs. Ethyl chloride was used for local anesthesia. Using a 25-gauge 1.5 inch needle the trigger point(s) was subsequently injected with a mixture of 1 cc of methylprednisolone 40 mg/mL and 2 cc of 1% lidocaine without epinephrine, with a total of 1.5 cc of injectate into each trigger point. A band-aid was applied following. Patient tolerated procedure well, there were no post-injection complications. Post-procedure instructions were given.    Clinical Data: No additional findings.   Subjective: Chief Complaint  Patient presents with   Right Hand - Pain    HPI Judith Martinez is a very pleasant 36 year-old female who presents for a few conditions.  -Carpal tunnel syndrome of the right wrist -she  reports she had an EMG/nerve conduction study back in 2018 that diagnosed with this.  She did some conservative treatment and has bothered her on and off since this time, however the last few months it has been exacerbated and worsening for her.  She gets numbness in the pain into the thumb.  Occasionally she will get numbness and tingling in the fourth and fifth digit as well.  She is very active at the gym and after using her hands and wrist frequently she will get pain and numbness tingling.  She is also noticed that she has been dropping items as well.  She has  used gabapentin, Lyrica, Elavil without relief of her neuropathy.   -Numbness and tingling of right side.  She will have occasional reports of numbness and tingling station both in the right upper extremity and right lower extremity.  She does have some hypertonicity of the right scapula and feels like some of this may be attributing from there.  She has been evaluated at [facility] per her report may have been x-rays of the neck to rule out it coming from that standpoint.  Nerve agents and anti-inflammatories she has tried Tylenol without much relief.  -She is also having right hip pain.  She has had pains before, however she has a pain that is dull and achy in the anterior aspect of the hip.  No pain with internal/external rotation.  She denies any swelling or redness.  No overlying skin changes reported.  She has not sought care noted any therapy for the hip to date.  *She does have a history of Hashimoto's thyroiditis, currently on Synthroid.  History of celiac disease. She is right-hand dominant.   Objective: Vital Signs: BP 123/73 (BP Location: Left Arm, Patient Position: Sitting, Cuff Size: Large)   Pulse 64   Resp 16   LMP 06/22/2022 (Approximate)   Physical Exam Gen: Well-appearing, in no acute distress; non-toxic CV: Regular Rate. Well-perfused. Warm.  Resp: Breathing unlabored on room air; no wheezing. Psych: Fluid speech in conversation; appropriate affect; normal thought process Neuro: Sensation intact throughout. No gross coordination deficits.    Ortho Exam  - Right wrist: Inspection of the wrist demonstrates no erythema, ecchymosis or swelling.  There is full range of motion with good flexion and extension as well as ulnar and radial deviation that is symmetric to the opposite wrist.  No bony TTP.  There is a positive Tinel's test over the volar aspect of the middle to distal carpal tunnel.  Symptoms reproducible with Phalen's testing.  Negative Tinel's at the cubital  tunnel.   - Right hip: No overlying skin changes.  There is significant hypertonicity of the psoas and hip flexor musculature.  There is restriction in hip flexion.  Negative logroll with smooth internal and external rotation.  There is weakness with hip abduction both bilaterally, however right worse than left.  Otherwise full full strength in all directions.  Negative straight leg raise.  Negative FADIR and FABER testing.  Imaging:  *Independent review of MRI of the cervical spine on 05/02/2021 was reviewed interpreted by myself.  There is some very mild disc bulging at the C6 and C7 level, however no neural impingement or stenosis within the spinal canal.  There is some loss of cervical lordosis, otherwise unremarkable.  EXAM: MRI CERVICAL SPINE WITHOUT CONTRAST   TECHNIQUE: Multiplanar, multisequence MR imaging of the cervical spine was performed. No intravenous contrast was administered.   COMPARISON:  Prior CT from 04/04/2008.  FINDINGS: Alignment: Straightening of the normal cervical lordosis. No listhesis.   Vertebrae: Vertebral body height maintained without acute or chronic fracture. Bone marrow signal intensity within normal limits. No discrete or worrisome osseous lesions. No abnormal marrow edema.   Cord: Normal signal and morphology.   Posterior Fossa, vertebral arteries, paraspinal tissues: Visualized brain and posterior fossa within normal limits. Craniocervical junction normal. Paraspinous and prevertebral soft tissues within normal limits. Normal intravascular flow voids seen within the vertebral arteries bilaterally.   Disc levels:   C2-C3: Unremarkable.   C3-C4:  Unremarkable.   C4-C5:  Unremarkable.   C5-C6: Mild annular disc bulge with disc desiccation. No spinal stenosis. Foramina remain patent.   C6-C7: Minimal annular disc bulge with disc desiccation. No spinal stenosis. Foramina remain patent.   C7-T1:  Unremarkable.   Visualized upper  thoracic spine demonstrates no significant finding.   IMPRESSION: 1. Minimal annular disc bulging at C5-6 and C6-7 without stenosis or neural impingement. 2. Otherwise unremarkable and normal MRI of the cervical spine.     Electronically Signed   By: Jeannine Boga M.D.   On: 05/03/2021 03:55   PMFS History: Patient Active Problem List   Diagnosis Date Noted   Right hip pain 01/13/2022   Chronic pain syndrome 07/27/2021   Pharmacologic therapy 07/27/2021   Disorder of skeletal system 07/27/2021   Problems influencing health status 07/27/2021   Encounter for chronic pain management 07/27/2021   Hypersomnia 02/11/2021   GERD (gastroesophageal reflux disease)    Hashimoto's thyroiditis 02/10/2021   Celiac disease 02/10/2021   History of anabolic steroid use 09/06/8526   Fibromyalgia 02/09/2021   Hypothyroidism 07/26/2020   Migraine headache without aura 08/01/2018   Panic attacks 07/30/2018   Mood disorder ([place]) 07/30/2018   Past Medical History:  Diagnosis Date   Celiac disease 02/10/2021   Fibromyalgia    GERD (gastroesophageal reflux disease)    Hashimoto's thyroiditis 02/10/2021   History of kidney stones 2013   History of pyelonephritis 2012   Hx of migraines    Panic attacks 07/30/2018    Family History  Problem Relation Age of Onset   Hypertension Father    Migraines Father    Hyperlipidemia Father    CAD Paternal Grandfather    Parkinson's disease Paternal Grandfather    Cancer Paternal Grandfather        leukemia   Stroke Paternal Grandfather    Colon polyps Paternal Grandfather    Cancer Sister 4       tumor in leg   Cancer Other        maternal great grandmother; breast   Diabetes Paternal Grandmother    Colon polyps Paternal Grandmother    Fibromyalgia Mother    Colon cancer Maternal Grandfather    Thyroid cancer Neg Hx    Pancreatic cancer Neg Hx    Esophageal cancer Neg Hx     Past Surgical History:  Procedure Laterality Date   BREAST  ENHANCEMENT SURGERY  2012   BREAST IMPLANT REMOVAL  2021   TONSILLECTOMY  1994   TUBAL LIGATION  10/13   tubal reversal  2016   Social History   Occupational History   Occupation: Diagnostic and Nurse, mental health  Tobacco Use   Smoking status: Never    Passive exposure: Past   Smokeless tobacco: Never  Vaping Use   Vaping Use: Never used  Substance and Sexual Activity   Alcohol use: Yes    Comment: Occasional   Drug use: No  Sexual activity: Yes    Partners: Male    Birth control/protection: None    Elba Barman, DO Sports Medicine Physician - [place]   This note was dictated using Dragon naturally speaking software and may contain errors in syntax, spelling, or content which have not been identified prior to signing this note.

## 2022-07-06 ENCOUNTER — Encounter: Payer: Self-pay | Admitting: Licensed Clinical Social Worker

## 2022-07-06 ENCOUNTER — Inpatient Hospital Stay: Payer: Managed Care, Other (non HMO) | Attending: Oncology | Admitting: Licensed Clinical Social Worker

## 2022-07-06 ENCOUNTER — Inpatient Hospital Stay: Payer: Managed Care, Other (non HMO)

## 2022-07-06 DIAGNOSIS — Z808 Family history of malignant neoplasm of other organs or systems: Secondary | ICD-10-CM | POA: Diagnosis not present

## 2022-07-06 DIAGNOSIS — Z803 Family history of malignant neoplasm of breast: Secondary | ICD-10-CM | POA: Diagnosis not present

## 2022-07-06 NOTE — Progress Notes (Signed)
REFERRING PROVIDER: Venia Carbon, MD [place],  [place] [address]  PRIMARY PROVIDER:  Venia Carbon, MD  PRIMARY REASON FOR VISIT:  1. Family history of breast cancer   2. Family history of brain cancer   3. Family history of melanoma      HISTORY OF PRESENT ILLNESS:   Ms. Colquhoun, a 36 y.o. female, was seen for a [facility] cancer genetics consultation at the request of Dr. Silvio Pate due to a family history of breast cancer.  Ms. Shreiner presents to clinic today to discuss the possibility of a hereditary predisposition to cancer, genetic testing, and to further clarify her future cancer risks, as well as potential cancer risks for family members.    CANCER HISTORY:  Ms. Arenas is a 36 y.o. female with no personal history of cancer.    RISK FACTORS:  Menarche was at age 22. First live birth at age 75.  OCP use for approximately 6 months Ovaries intact: yes.  Hysterectomy: no.  Menopausal status: premenopausal.  HRT use: 0 years. Colonoscopy: yes;  for celiac, normal . Up to date with pelvic exams: yes.  Past Medical History:  Diagnosis Date   Celiac disease 02/10/2021   Fibromyalgia    GERD (gastroesophageal reflux disease)    Hashimoto's thyroiditis 02/10/2021   History of kidney stones 2013   History of pyelonephritis 2012   Hx of migraines    Panic attacks 07/30/2018    Past Surgical History:  Procedure Laterality Date   BREAST ENHANCEMENT SURGERY  2012   BREAST IMPLANT REMOVAL  2021   TONSILLECTOMY  1994   TUBAL LIGATION  10/13   tubal reversal  2016    FAMILY HISTORY:  We obtained a detailed, 4-generation family history.  Significant diagnoses are listed below: Family History  Problem Relation Age of Onset   Fibromyalgia Mother    Breast cancer Mother 47   Hypertension Father    Migraines Father    Hyperlipidemia Father    Cancer Sister 4       soft tissue sarcoma in leg   Brain cancer Paternal Uncle 92   Breast cancer Maternal  Grandmother        dx 55s   Brain cancer Maternal Grandfather    Melanoma Maternal Grandfather    Diabetes Paternal Grandmother    Colon polyps Paternal Grandmother    CAD Paternal Grandfather    Parkinson's disease Paternal Grandfather    Cancer Paternal Grandfather        leukemia   Stroke Paternal Grandfather    Colon polyps Paternal Grandfather    Cancer Other        maternal great grandmother; breast   Thyroid cancer Neg Hx    Pancreatic cancer Neg Hx    Esophageal cancer Neg Hx    Ms. Oleksy has 2 sons, 63 and 67, and 3 daughters, 45, 57 and 27. She has 1 sister who had a soft tissue sarcoma of her leg at age 76 and is living at 92.  Ms. Scali mother was recently diagnosed with breast cancer at 64. She lives in [place]. Maternal grandmother also was recently diagnosed with breast cancer in her 51s. Her mother, patient's great grandmother, had breast cancer, and her father had brain cancer. Maternal grandfather had melanoma and brain cancer.  Ms. Monger father is 3 with no cancer history. Patient has a paternal uncle who passed at age 32 from brain stem cancer. A paternal great aunt (unknown  cancer type) and first cousin once removed (brain) had cancer.   Ms. Derderian is unaware of previous family history of genetic testing for hereditary cancer risks. There is no reported Ashkenazi Jewish ancestry. There is no known consanguinity.    GENETIC COUNSELING ASSESSMENT: Ms. Mcquown is a 36 y.o. female with a family history of breast cancer which is somewhat suggestive of a hereditary cancer syndrome and predisposition to cancer. We, therefore, discussed and recommended the following at today's visit.   DISCUSSION: We discussed that approximately 10% of breast cancer is hereditary. Most cases of hereditary breast cancer are associated with BRCA1/BRCA2 genes, although there are other genes associated with hereditary cancer as well. Cancers and risks are gene specific. We discussed that  testing is beneficial for several reasons including knowing about cancer risks, identifying potential screening and risk-reduction options that may be appropriate, and to understand if other family members could be at risk for cancer and allow them to undergo genetic testing.   We reviewed the characteristics, features and inheritance patterns of hereditary cancer syndromes. We also discussed genetic testing, including the appropriate family members to test, the process of testing, insurance coverage and turn-around-time for results. We discussed the implications of a negative, positive and/or variant of uncertain significant result. We recommended Ms. Kronick pursue genetic testing for the [facility] CancerNext-Expanded+RNA gene panel.   Based on Ms. Villamor's family history of cancer, she meets medical criteria for genetic testing. Despite that she meets criteria, she may still have an out of pocket cost. We discussed that if her out of pocket cost for testing is over $100, the laboratory will call and confirm whether she wants to proceed with testing.  If the out of pocket cost of testing is less than $100 she will be billed by the genetic testing laboratory.   PLAN: After considering the risks, benefits, and limitations, Ms. Vigen provided informed consent to pursue genetic testing and the blood sample was sent to [facility] for analysis of the CancerNext-Expanded+RNA panel. Results should be available within approximately 2-3 weeks' time, at which point they will be disclosed by telephone to Ms. Frink, as will any additional recommendations warranted by these results. Ms. Babich will receive a summary of her genetic counseling visit and a copy of her results once available. This information will also be available in Epic.   Ms. Schaffer questions were answered to her satisfaction today. Our contact information was provided should additional questions or concerns arise. Thank you for the referral and  allowing [place] to share in the care of your patient.   Faith Rogue, MS, [facility] Genetic Counselor [email or web address] Phone: [phone]  The patient was seen for a total of 25 minutes in face-to-face genetic counseling.  Dr. Grayland Ormond was available for discussion regarding this case.   _______________________________________________________________________ For Office Staff:  Number of people involved in session: 1 Was an Intern/ student involved with case: no

## 2022-07-10 ENCOUNTER — Other Ambulatory Visit: Payer: Self-pay | Admitting: Sports Medicine

## 2022-07-10 DIAGNOSIS — G5601 Carpal tunnel syndrome, right upper limb: Secondary | ICD-10-CM

## 2022-07-10 DIAGNOSIS — G5691 Unspecified mononeuropathy of right upper limb: Secondary | ICD-10-CM

## 2022-07-11 ENCOUNTER — Encounter: Payer: Self-pay | Admitting: Sports Medicine

## 2022-07-12 ENCOUNTER — Other Ambulatory Visit: Payer: Managed Care, Other (non HMO)

## 2022-07-12 ENCOUNTER — Encounter: Payer: Managed Care, Other (non HMO) | Admitting: Licensed Clinical Social Worker

## 2022-07-12 ENCOUNTER — Other Ambulatory Visit: Payer: Self-pay

## 2022-07-12 DIAGNOSIS — M24551 Contracture, right hip: Secondary | ICD-10-CM

## 2022-07-12 DIAGNOSIS — G5691 Unspecified mononeuropathy of right upper limb: Secondary | ICD-10-CM

## 2022-07-19 ENCOUNTER — Encounter: Payer: Self-pay | Admitting: Internal Medicine

## 2022-07-19 DIAGNOSIS — E7800 Pure hypercholesterolemia, unspecified: Secondary | ICD-10-CM

## 2022-07-25 ENCOUNTER — Ambulatory Visit: Payer: Managed Care, Other (non HMO)

## 2022-07-26 ENCOUNTER — Telehealth: Payer: Self-pay | Admitting: Licensed Clinical Social Worker

## 2022-07-26 ENCOUNTER — Encounter: Payer: Self-pay | Admitting: Licensed Clinical Social Worker

## 2022-07-26 ENCOUNTER — Ambulatory Visit: Payer: Self-pay | Admitting: Licensed Clinical Social Worker

## 2022-07-26 DIAGNOSIS — Z1379 Encounter for other screening for genetic and chromosomal anomalies: Secondary | ICD-10-CM | POA: Insufficient documentation

## 2022-07-26 NOTE — Telephone Encounter (Signed)
I contacted Ms. Gundlach to discuss her genetic testing results. No pathogenic variants were identified in the 77 genes analyzed. Detailed clinic note to follow.   The test report has been scanned into EPIC and is located under the Molecular Pathology section of the Results Review tab.  A portion of the result report is included below for reference.     Faith Rogue, MS, [facility] Genetic Counselor [email or web address] Phone: [phone]

## 2022-07-26 NOTE — Progress Notes (Signed)
HPI:  Judith Martinez was previously seen in the [place] clinic due to a family history of cancer and concerns regarding a hereditary predisposition to cancer. Please refer to our prior cancer genetics clinic note for more information regarding our discussion, assessment and recommendations, at the time. Judith Martinez recent genetic test results were disclosed to her, as were recommendations warranted by these results. These results and recommendations are discussed in more detail below.  CANCER HISTORY:  Oncology History   No history exists.    FAMILY HISTORY:  We obtained a detailed, 4-generation family history.  Significant diagnoses are listed below: Family History  Problem Relation Age of Onset   Fibromyalgia Mother    Breast cancer Mother 42   Hypertension Father    Migraines Father    Hyperlipidemia Father    Cancer Sister 4       soft tissue sarcoma in leg   Brain cancer Paternal Uncle 3   Breast cancer Maternal Grandmother        dx 58s   Brain cancer Maternal Grandfather    Melanoma Maternal Grandfather    Diabetes Paternal Grandmother    Colon polyps Paternal Grandmother    CAD Paternal Grandfather    Parkinson's disease Paternal Grandfather    Cancer Paternal Grandfather        leukemia   Stroke Paternal Grandfather    Colon polyps Paternal Grandfather    Cancer Other        maternal great grandmother; breast   Thyroid cancer Neg Hx    Pancreatic cancer Neg Hx    Esophageal cancer Neg Hx    Judith Martinez has 2 sons, 64 and 62, and 3 daughters, 15, 54 and 35. She has 1 sister who had a soft tissue sarcoma of her leg at age 68 and is living at 36.   Judith Martinez mother was recently diagnosed with breast cancer at 28. She lives in [place]. Maternal grandmother also was recently diagnosed with breast cancer in her 49s. Her mother, patient's great grandmother, had breast cancer, and her father had brain cancer. Maternal grandfather had melanoma and brain  cancer.   Judith Martinez father is 30 with no cancer history. Patient has a paternal uncle who passed at age 68 from brain stem cancer. A paternal great aunt (unknown cancer type) and first cousin once removed (brain) had cancer.    Judith Martinez is unaware of previous family history of genetic testing for hereditary cancer risks. There is no reported Ashkenazi Jewish ancestry. There is no known consanguinity.      GENETIC TEST RESULTS: Genetic testing reported out on 07/24/2022 through the [facility] CancerNext-Expanded+RNA cancer panel found no pathogenic mutations.   The CancerNext-Expanded + RNAinsight gene panel offered by [employer] and includes sequencing and rearrangement analysis for the following 77 genes: IP, ALK, APC*, ATM*, AXIN2, BAP1, BARD1, BLM, BMPR1A, BRCA1*, BRCA2*, BRIP1*, CDC73, CDH1*,CDK4, CDKN1B, CDKN2A, CHEK2*, CTNNA1, DICER1, FANCC, FH, FLCN, GALNT12, KIF1B, LZTR1, MAX, MEN1, MET, MLH1*, MSH2*, MSH3, MSH6*, MUTYH*, NBN, NF1*, NF2, NTHL1, PALB2*, PHOX2B, PMS2*, POT1, PRKAR1A, PTCH1, PTEN*, RAD51C*, RAD51D*,RB1, RECQL, RET, SDHA, SDHAF2, SDHB, SDHC, SDHD, SMAD4, SMARCA4, SMARCB1, SMARCE1, STK11, SUFU, TMEM127, TP53*,TSC1, TSC2, VHL and XRCC2 (sequencing and deletion/duplication); EGFR, EGLN1, HOXB13, KIT, MITF, PDGFRA, POLD1 and POLE (sequencing only); EPCAM and GREM1 (deletion/duplication only).   The test report has been scanned into EPIC and is located under the Molecular Pathology section of the Results Review tab.  A portion of the result report  is included below for reference.     We discussed that because current genetic testing is not perfect, it is possible there may be a gene mutation in one of these genes that current testing cannot detect, but that chance is small.  There could be another gene that has not yet been discovered, or that we have not yet tested, that is responsible for the cancer diagnoses in the family. It is also possible there is a hereditary cause for the  cancer in the family that Judith Martinez did not inherit and therefore was not identified in her testing.  Therefore, it is important to remain in touch with cancer genetics in the future so that we can continue to offer Judith Martinez the most up to date genetic testing.   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  At this time, it is unknown if this variant is associated with increased cancer risk or if this is a normal finding, but most variants such as this get reclassified to being inconsequential. It should not be used to make medical management decisions. With time, we suspect the lab will determine the significance of this variant, if any. If we do learn more about it we will try to contact Judith Martinez to discuss it further. However, it is important to stay in touch with [place] periodically and keep the address and phone number up to date.  ADDITIONAL GENETIC TESTING: We discussed with Judith Martinez that her genetic testing was fairly extensive.  If there are genes identified to increase cancer risk that can be analyzed in the future, we would be happy to discuss and coordinate this testing at that time.    CANCER SCREENING RECOMMENDATIONS: Judith Martinez test result is considered negative (normal).  This means that we have not identified a hereditary cause for her family history of cancer at this time.  While reassuring, this does not definitively rule out a hereditary predisposition to cancer. It is still possible that there could be genetic mutations that are undetectable by current technology. There could be genetic mutations in genes that have not been tested or identified to increase cancer risk.  Therefore, it is recommended she continue to follow the cancer management and screening guidelines provided by her  primary healthcare provider.   An individual's cancer risk and medical management are not determined by genetic test results alone. Overall cancer risk  assessment incorporates additional factors, including personal medical history, family history, and any available genetic information that may result in a personalized plan for cancer prevention and surveillance.  Based on Judith Martinez's personal and family history of cancer as well as her genetic test results, risk model Harriett Rush was used to estimate her risk of developing breast cancer. This estimates her lifetime risk of developing breast cancer to be approximately 17%.  The patient's lifetime breast cancer risk is a preliminary estimate based on available information using one of several models endorsed by the [employer]  (NCCN). The NCCN recommends consideration of breast MRI screening as an adjunct to mammography for patients at high risk (defined as 20% or greater lifetime risk).  This risk estimate can change over time and may be repeated to reflect new information in her personal or family history in the future.     RECOMMENDATIONS FOR FAMILY MEMBERS:  Relatives in this family might be at some increased risk of developing cancer, over the general population risk, simply due to the family  history of cancer.  We recommended female relatives in this family have a yearly mammogram beginning at age 15, or 59 years younger than the earliest onset of cancer, an annual clinical breast exam, and perform monthly breast self-exams. Female relatives in this family should also have a gynecological exam as recommended by their primary provider.  All family members should be referred for colonoscopy starting at age 23.    It is also possible there is a hereditary cause for the cancer in Judith Martinez's family that she did not inherit and therefore was not identified in her.  Based on Judith Martinez's family history, we recommended her mother have genetic counseling and testing. Judith Martinez will let us know if we can be of any assistance in coordinating genetic counseling and/or testing for these  family members.  FOLLOW-UP: Lastly, we discussed with Ms. Howdyshell that cancer genetics is a rapidly advancing field and it is possible that new genetic tests will be appropriate for her and/or her family members in the future. We encouraged her to remain in contact with cancer genetics on an annual basis so we can update her personal and family histories and let her know of advances in cancer genetics that may benefit this family.   Our contact number was provided. Ms. Birmingham questions were answered to her satisfaction, and she knows she is welcome to call us at anytime with additional questions or concerns.   Faith Rogue, MS, [facility] Genetic Counselor [email or web address] Phone: [phone]

## 2022-08-03 ENCOUNTER — Ambulatory Visit: Payer: Managed Care, Other (non HMO) | Admitting: Internal Medicine

## 2022-08-06 ENCOUNTER — Encounter: Payer: Self-pay | Admitting: Internal Medicine

## 2022-08-06 ENCOUNTER — Other Ambulatory Visit: Payer: Self-pay | Admitting: Family

## 2022-08-06 DIAGNOSIS — E063 Autoimmune thyroiditis: Secondary | ICD-10-CM

## 2022-08-07 MED ORDER — ALPRAZOLAM 0.25 MG PO TABS
0.2500 mg | ORAL_TABLET | Freq: Two times a day (BID) | ORAL | 0 refills | Status: DC | PRN
Start: 2022-08-07 — End: 2022-08-28

## 2022-08-10 ENCOUNTER — Encounter: Payer: Self-pay | Admitting: Physical Medicine and Rehabilitation

## 2022-08-11 ENCOUNTER — Encounter: Payer: Managed Care, Other (non HMO) | Admitting: Physical Medicine and Rehabilitation

## 2022-08-13 HISTORY — PX: TOTAL THYROIDECTOMY: SHX2547

## 2022-08-22 ENCOUNTER — Encounter: Payer: Managed Care, Other (non HMO) | Admitting: Physical Medicine and Rehabilitation

## 2022-08-27 ENCOUNTER — Encounter: Payer: Self-pay | Admitting: Internal Medicine

## 2022-08-28 ENCOUNTER — Other Ambulatory Visit: Payer: Self-pay | Admitting: Internal Medicine

## 2022-08-28 ENCOUNTER — Other Ambulatory Visit: Payer: Self-pay

## 2022-08-28 ENCOUNTER — Encounter: Payer: Self-pay | Admitting: Internal Medicine

## 2022-08-28 ENCOUNTER — Telehealth: Payer: Self-pay

## 2022-08-28 DIAGNOSIS — E038 Other specified hypothyroidism: Secondary | ICD-10-CM

## 2022-08-28 MED ORDER — LEVOTHYROXINE SODIUM 112 MCG PO TABS: 112.0000 ug | ORAL_TABLET | Freq: Every day | ORAL | 3 refills | Status: DC

## 2022-08-28 MED ORDER — ALPRAZOLAM 0.5 MG PO TABS: 0.5000 mg | ORAL_TABLET | Freq: Two times a day (BID) | ORAL | 0 refills | Status: DC | PRN

## 2022-08-28 NOTE — Telephone Encounter (Signed)
Attempted to call and could not leave message due to mailbox being full, but will continue to try.

## 2022-08-28 NOTE — Telephone Encounter (Signed)
Contact patient to get her on schedule for appointment. She has had a surgery and provider has to see her before any changes can be made. If we have cancellation offer her the slot.

## 2022-08-28 NOTE — Telephone Encounter (Signed)
Patient was contacted, labs were released to [place] on 08/28/22 @ 09:49 AM. Patient also requested a referral to another endocrinologist.  She requested Dr Meredith Pel at [facility].  Referral was put in outgoing referral que que

## 2022-08-29 ENCOUNTER — Telehealth: Payer: Self-pay | Admitting: Internal Medicine

## 2022-08-29 NOTE — Telephone Encounter (Signed)
Patient dismissed from [facility] and all providers practicing at this clinic. 08/28/22

## 2022-08-30 ENCOUNTER — Telehealth: Payer: Self-pay

## 2022-08-30 ENCOUNTER — Other Ambulatory Visit: Payer: Self-pay | Admitting: Internal Medicine

## 2022-08-30 NOTE — Telephone Encounter (Signed)
Prior auth started for Synthroid 112MCG tablets. Adline Potter Key: [id number] - [place] Case ID: [id number] - Rx #: [id number] Waiting for determination.

## 2022-08-31 ENCOUNTER — Telehealth: Payer: Self-pay | Admitting: Physical Medicine and Rehabilitation

## 2022-08-31 MED ORDER — LEVOTHYROXINE SODIUM 112 MCG PO TABS: 112.0000 ug | ORAL_TABLET | Freq: Every day | ORAL | 0 refills | Status: DC

## 2022-08-31 NOTE — Addendum Note (Signed)
Addended by: Pilar Grammes on: 08/31/2022 12:46 PM   Modules accepted: Orders

## 2022-08-31 NOTE — Telephone Encounter (Signed)
rescheduled

## 2022-08-31 NOTE — Telephone Encounter (Signed)
Patient called. Need to [facility] her appointment with Dr. Ernestina Patches.

## 2022-09-01 ENCOUNTER — Encounter: Payer: Self-pay | Admitting: *Deleted

## 2022-09-01 ENCOUNTER — Encounter: Payer: Managed Care, Other (non HMO) | Admitting: Physical Medicine and Rehabilitation

## 2022-09-01 NOTE — Telephone Encounter (Signed)
Authorizations was denied see letter below. Copy sent to scan of letter   I have reviewed the request to cover Synthroid 112 mcg TABLET. The information submitted did  not meet the criteria necessary to approve this medication. Based on the information provided, I am unable to approve coverage for this medication because: There is no indication that the individual has one of the following: 1. Congenital hypothyroidism in  children 35 years of age or younger; 2. thyroid cancer; 3. Pregnancy; or 4. There is documentation  of both of the following: A. The individual has tried a bioequivalent generic form of the drug; and B.  The individual cannot take the bioequivalent generic form of the drug due to an allergy or bad  reaction to one or more inactive ingredients (dyes, fillers, preservatives) which are not present in  the brand form of the drug. Synthroid (levothyroxine) is considered medically necessary when the individual meets the  following criteria: 1. documentation that individual has tried levothyroxine (generic for Synthroid)  and cannot take due to a formulation difference in the inactive ingredient(s) [for example,  difference in dyes, fillers, preservatives] between the brand and the bioequivalent generic product  which, per the prescribing physician, would result in a significant allergy or serious adverse  reaction; or 2. Has one of the following conditions: A. Congenital hypothyroidism in children 3 years  of age or younger; B. Diagnosis of thyroid cancer; C. Pregnancy. Thyroid hormone supplement  products are considered medically necessary for continued use when initial criteria are met and  documentation of beneficial response. [employer] Coverage Policy [id number] Thyroid Hormone Supplements was used to complete this review.

## 2022-09-04 NOTE — Telephone Encounter (Signed)
I sent this information in the MyChart message the pt has going with Dr Silvio Pate. In it, the pt asks for the generic as she has had in the past until she can get the name brand. I copied the response from insurance on the MyChart message.

## 2022-09-06 LAB — T4, FREE: Free T4: 1.41 ng/dL (ref 0.82–1.77)

## 2022-09-06 LAB — TSH: TSH: 8.71 u[IU]/mL — ABNORMAL HIGH (ref 0.450–4.500)

## 2022-09-06 LAB — T3: T3, Total: 49 ng/dL — ABNORMAL LOW (ref 71–180)

## 2022-09-06 MED ORDER — LEVOTHYROXINE SODIUM 125 MCG PO TABS: 125.0000 ug | ORAL_TABLET | Freq: Every day | ORAL | 3 refills | Status: DC

## 2022-09-06 NOTE — Telephone Encounter (Addendum)
Pretty sure pt had requested name brand and that is how it was sent DAW. I will send new rx for generic. Do I send 125 mcg?

## 2022-09-06 NOTE — Addendum Note (Signed)
Addended by: Pilar Grammes on: 09/06/2022 12:50 PM   Modules accepted: Orders

## 2022-09-08 ENCOUNTER — Other Ambulatory Visit: Payer: Self-pay | Admitting: Nurse Practitioner

## 2022-09-08 DIAGNOSIS — R110 Nausea: Secondary | ICD-10-CM

## 2022-09-12 ENCOUNTER — Encounter: Payer: Managed Care, Other (non HMO) | Admitting: Physical Medicine and Rehabilitation

## 2022-09-18 ENCOUNTER — Other Ambulatory Visit: Payer: Self-pay | Admitting: Internal Medicine

## 2022-09-27 ENCOUNTER — Other Ambulatory Visit: Payer: Self-pay | Admitting: Internal Medicine

## 2022-10-04 MED ORDER — LEVOTHYROXINE SODIUM 150 MCG PO TABS: 150.0000 ug | ORAL_TABLET | Freq: Every day | ORAL | 3 refills | Status: DC

## 2022-10-04 NOTE — Addendum Note (Signed)
Addended by: Viviana Simpler I on: 10/04/2022 03:38 PM   Modules accepted: Orders

## 2022-10-31 ENCOUNTER — Other Ambulatory Visit: Payer: Self-pay | Admitting: Internal Medicine

## 2022-10-31 NOTE — Telephone Encounter (Signed)
Last filled 09-27-22 #30 Last OV 01-13-22 No Future OV [facility]

## 2022-12-03 ENCOUNTER — Other Ambulatory Visit: Payer: Self-pay | Admitting: Family

## 2022-12-12 ENCOUNTER — Other Ambulatory Visit: Payer: Self-pay | Admitting: Internal Medicine

## 2022-12-12 ENCOUNTER — Encounter: Payer: Self-pay | Admitting: Internal Medicine

## 2022-12-13 MED ORDER — ALPRAZOLAM 0.5 MG PO TABS: ORAL_TABLET | ORAL | 0 refills | Status: DC

## 2022-12-18 ENCOUNTER — Encounter (HOSPITAL_BASED_OUTPATIENT_CLINIC_OR_DEPARTMENT_OTHER): Payer: Self-pay

## 2022-12-18 ENCOUNTER — Ambulatory Visit (HOSPITAL_BASED_OUTPATIENT_CLINIC_OR_DEPARTMENT_OTHER): Payer: Managed Care, Other (non HMO) | Admitting: Internal Medicine

## 2023-01-11 ENCOUNTER — Other Ambulatory Visit: Payer: Self-pay | Admitting: Internal Medicine

## 2023-01-12 NOTE — Telephone Encounter (Signed)
Last filled 12-13-22 #30 Last OV 01-13-22 No Future OV [facility]

## 2023-01-29 ENCOUNTER — Other Ambulatory Visit: Payer: Self-pay | Admitting: Internal Medicine

## 2023-01-29 NOTE — Telephone Encounter (Signed)
Please contact pt to set up CPE with Dr Silvio Pate in the next 3 months. Thanks.

## 2023-01-29 NOTE — Telephone Encounter (Signed)
Spoke to pt, scheduled cpe for 03/05/23

## 2023-03-01 ENCOUNTER — Other Ambulatory Visit: Payer: Self-pay | Admitting: Internal Medicine

## 2023-03-01 NOTE — Telephone Encounter (Signed)
Last filled 01-12-23 #30 Last OV 01-13-22 Next OV 03-05-23 [facility]

## 2023-03-05 ENCOUNTER — Encounter: Payer: Managed Care, Other (non HMO) | Admitting: Internal Medicine

## 2023-03-16 ENCOUNTER — Encounter: Payer: Self-pay | Admitting: Internal Medicine

## 2023-03-16 ENCOUNTER — Ambulatory Visit (INDEPENDENT_AMBULATORY_CARE_PROVIDER_SITE_OTHER): Payer: Managed Care, Other (non HMO) | Admitting: Internal Medicine

## 2023-03-16 VITALS — BP 122/86 | HR 77 | Temp 97.7°F | Ht 66.0 in | Wt 157.0 lb

## 2023-03-16 DIAGNOSIS — Z0000 Encounter for general adult medical examination without abnormal findings: Secondary | ICD-10-CM | POA: Insufficient documentation

## 2023-03-16 DIAGNOSIS — E039 Hypothyroidism, unspecified: Secondary | ICD-10-CM

## 2023-03-16 DIAGNOSIS — K900 Celiac disease: Secondary | ICD-10-CM

## 2023-03-16 NOTE — Assessment & Plan Note (Signed)
Careful with gluten

## 2023-03-16 NOTE — Assessment & Plan Note (Signed)
Healthy Some trouble with concentration but it may be due to overscheduling and not enough sleep--discussed Prefers no COVID or flu vaccines Thinks she got another tetanus booster Exercises regularly Sees Gyn
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
## 2023-03-16 NOTE — Assessment & Plan Note (Signed)
Last labs look good on levothyroxine 150 daily

## 2023-03-31 ENCOUNTER — Other Ambulatory Visit: Payer: Self-pay | Admitting: Internal Medicine

## 2023-04-30 ENCOUNTER — Encounter: Payer: Self-pay | Admitting: Internal Medicine

## 2023-04-30 ENCOUNTER — Other Ambulatory Visit: Payer: Self-pay | Admitting: Internal Medicine

## 2023-05-01 ENCOUNTER — Other Ambulatory Visit: Payer: Self-pay

## 2023-05-01 MED ORDER — ALPRAZOLAM 0.5 MG PO TABS
0.5000 mg | ORAL_TABLET | Freq: Two times a day (BID) | ORAL | 0 refills | Status: DC | PRN
Start: 2023-05-01 — End: 2023-06-13

## 2023-05-01 NOTE — Telephone Encounter (Signed)
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 To avoid usage of anabolic and keep it healthy. It is polygraphed and tested to make sure no body takes things to help there conditioning for competition. :)

## 2023-05-01 NOTE — Telephone Encounter (Signed)
[facility]  Requesting: ALPRAZolam (XANAX) 0.5 MG tablet  Contract: No UDS: None Last Visit:03/16/23 Next Visit: None scheduled Last Refill: 03/01/23  Please Advise

## 2023-06-12 ENCOUNTER — Other Ambulatory Visit: Payer: Self-pay | Admitting: Internal Medicine

## 2023-06-13 NOTE — Telephone Encounter (Signed)
Last filled 05-01-23 #30 Last OV 03-16-23 No Future OV [facility]

## 2023-07-09 DIAGNOSIS — M7521 Bicipital tendinitis, right shoulder: Secondary | ICD-10-CM | POA: Diagnosis not present

## 2023-07-17 ENCOUNTER — Other Ambulatory Visit: Payer: Self-pay | Admitting: Internal Medicine

## 2023-07-19 ENCOUNTER — Institutional Professional Consult (permissible substitution) (HOSPITAL_BASED_OUTPATIENT_CLINIC_OR_DEPARTMENT_OTHER): Payer: Managed Care, Other (non HMO) | Admitting: Internal Medicine

## 2023-07-19 NOTE — Telephone Encounter (Signed)
Last filled 06-13-23 #30 Last OV 03-16-23 No Future OV [facility]

## 2023-08-20 DIAGNOSIS — N912 Amenorrhea, unspecified: Secondary | ICD-10-CM | POA: Diagnosis not present

## 2023-08-20 DIAGNOSIS — Z3141 Encounter for fertility testing: Secondary | ICD-10-CM | POA: Diagnosis not present

## 2023-08-25 ENCOUNTER — Other Ambulatory Visit: Payer: Self-pay | Admitting: Internal Medicine

## 2023-08-27 NOTE — Telephone Encounter (Signed)
Last filled 09-0-24 #30 Last OV 03-16-23  No Future OV [facility]

## 2023-09-04 ENCOUNTER — Encounter: Payer: Self-pay | Admitting: Internal Medicine

## 2023-09-04 MED ORDER — LEVOTHYROXINE SODIUM 175 MCG PO TABS: 175.0000 ug | ORAL_TABLET | Freq: Every day | ORAL | 3 refills | Status: DC

## 2023-09-06 DIAGNOSIS — Z3169 Encounter for other general counseling and advice on procreation: Secondary | ICD-10-CM | POA: Diagnosis not present

## 2023-09-06 DIAGNOSIS — N912 Amenorrhea, unspecified: Secondary | ICD-10-CM | POA: Diagnosis not present

## 2023-10-03 DIAGNOSIS — N912 Amenorrhea, unspecified: Secondary | ICD-10-CM | POA: Diagnosis not present

## 2023-10-03 DIAGNOSIS — Z3189 Encounter for other procreative management: Secondary | ICD-10-CM | POA: Diagnosis not present

## 2023-10-05 DIAGNOSIS — Z3189 Encounter for other procreative management: Secondary | ICD-10-CM | POA: Diagnosis not present

## 2023-10-05 DIAGNOSIS — N912 Amenorrhea, unspecified: Secondary | ICD-10-CM | POA: Diagnosis not present

## 2023-11-02 DIAGNOSIS — Z3189 Encounter for other procreative management: Secondary | ICD-10-CM | POA: Diagnosis not present

## 2023-11-02 DIAGNOSIS — N912 Amenorrhea, unspecified: Secondary | ICD-10-CM | POA: Diagnosis not present

## 2023-11-21 ENCOUNTER — Encounter: Payer: Self-pay | Admitting: Internal Medicine

## 2023-11-21 DIAGNOSIS — R110 Nausea: Secondary | ICD-10-CM

## 2023-11-21 MED ORDER — ONDANSETRON HCL 8 MG PO TABS: ORAL_TABLET | ORAL | 1 refills | Status: DC

## 2023-12-21 ENCOUNTER — Other Ambulatory Visit: Payer: Self-pay

## 2023-12-22 MED ORDER — ALPRAZOLAM 0.5 MG PO TABS: 0.5000 mg | ORAL_TABLET | Freq: Two times a day (BID) | ORAL | 0 refills | Status: DC | PRN

## 2023-12-22 NOTE — Telephone Encounter (Signed)
 Please find out if she has moved

## 2023-12-24 NOTE — Telephone Encounter (Signed)
Okay that is fine

## 2023-12-24 NOTE — Telephone Encounter (Signed)
 Called and spoke with patient she moved to [place], [place] to be closer to her job, but she would like to keep coming to this practice.

## 2023-12-28 DIAGNOSIS — N912 Amenorrhea, unspecified: Secondary | ICD-10-CM | POA: Diagnosis not present

## 2023-12-28 DIAGNOSIS — Z3183 Encounter for assisted reproductive fertility procedure cycle: Secondary | ICD-10-CM | POA: Diagnosis not present

## 2024-01-14 DIAGNOSIS — R1031 Right lower quadrant pain: Secondary | ICD-10-CM | POA: Diagnosis not present

## 2024-01-14 DIAGNOSIS — N134 Hydroureter: Secondary | ICD-10-CM | POA: Diagnosis not present

## 2024-01-14 DIAGNOSIS — K5900 Constipation, unspecified: Secondary | ICD-10-CM | POA: Diagnosis not present

## 2024-01-14 DIAGNOSIS — R109 Unspecified abdominal pain: Secondary | ICD-10-CM | POA: Diagnosis not present

## 2024-01-14 DIAGNOSIS — R188 Other ascites: Secondary | ICD-10-CM | POA: Diagnosis not present

## 2024-01-15 ENCOUNTER — Encounter: Payer: Self-pay | Admitting: Internal Medicine

## 2024-01-15 ENCOUNTER — Ambulatory Visit: Payer: Self-pay | Admitting: Internal Medicine

## 2024-01-15 DIAGNOSIS — R1084 Generalized abdominal pain: Secondary | ICD-10-CM | POA: Diagnosis not present

## 2024-01-15 DIAGNOSIS — R112 Nausea with vomiting, unspecified: Secondary | ICD-10-CM | POA: Diagnosis not present

## 2024-01-15 DIAGNOSIS — N760 Acute vaginitis: Secondary | ICD-10-CM | POA: Diagnosis not present

## 2024-01-15 DIAGNOSIS — B9689 Other specified bacterial agents as the cause of diseases classified elsewhere: Secondary | ICD-10-CM | POA: Diagnosis not present

## 2024-01-15 DIAGNOSIS — Z87891 Personal history of nicotine dependence: Secondary | ICD-10-CM | POA: Diagnosis not present

## 2024-01-15 DIAGNOSIS — K5900 Constipation, unspecified: Secondary | ICD-10-CM | POA: Diagnosis not present

## 2024-01-15 DIAGNOSIS — R102 Pelvic and perineal pain: Secondary | ICD-10-CM | POA: Diagnosis not present

## 2024-01-15 DIAGNOSIS — R109 Unspecified abdominal pain: Secondary | ICD-10-CM | POA: Diagnosis not present

## 2024-01-15 NOTE — Telephone Encounter (Signed)
 3rd attempt to get in contact with patient. Left message to call office back. Will route to office.

## 2024-01-15 NOTE — Telephone Encounter (Signed)
 2nd attempt, left voicemail with callback number for patient to return nurse triage call.

## 2024-01-15 NOTE — Telephone Encounter (Signed)
 1st attempt to call patient-voicemail left for patient to call office back. Patient was seen in [facility] ED last night and sent a message to office today.

## 2024-01-28 DIAGNOSIS — Z630 Problems in relationship with spouse or partner: Secondary | ICD-10-CM | POA: Diagnosis not present

## 2024-01-28 DIAGNOSIS — F4322 Adjustment disorder with anxiety: Secondary | ICD-10-CM | POA: Diagnosis not present

## 2024-02-11 DIAGNOSIS — F4320 Adjustment disorder, unspecified: Secondary | ICD-10-CM | POA: Diagnosis not present

## 2024-02-25 DIAGNOSIS — Z1152 Encounter for screening for COVID-19: Secondary | ICD-10-CM | POA: Diagnosis not present

## 2024-02-25 DIAGNOSIS — R051 Acute cough: Secondary | ICD-10-CM | POA: Diagnosis not present

## 2024-02-25 DIAGNOSIS — J069 Acute upper respiratory infection, unspecified: Secondary | ICD-10-CM | POA: Diagnosis not present

## 2024-02-25 DIAGNOSIS — Z8719 Personal history of other diseases of the digestive system: Secondary | ICD-10-CM | POA: Diagnosis not present

## 2024-03-03 DIAGNOSIS — F4320 Adjustment disorder, unspecified: Secondary | ICD-10-CM | POA: Diagnosis not present

## 2024-03-10 DIAGNOSIS — F4322 Adjustment disorder with anxiety: Secondary | ICD-10-CM | POA: Diagnosis not present

## 2024-03-24 ENCOUNTER — Encounter: Payer: Managed Care, Other (non HMO) | Admitting: Internal Medicine

## 2024-05-27 DIAGNOSIS — M4722 Other spondylosis with radiculopathy, cervical region: Secondary | ICD-10-CM | POA: Diagnosis not present

## 2024-05-27 DIAGNOSIS — S161XXA Strain of muscle, fascia and tendon at neck level, initial encounter: Secondary | ICD-10-CM | POA: Diagnosis not present

## 2024-05-28 ENCOUNTER — Encounter: Payer: Self-pay | Admitting: Internal Medicine

## 2024-05-29 DIAGNOSIS — F4322 Adjustment disorder with anxiety: Secondary | ICD-10-CM | POA: Diagnosis not present

## 2024-05-29 MED ORDER — LEVOTHYROXINE SODIUM 175 MCG PO TABS: 175.0000 ug | ORAL_TABLET | Freq: Every day | ORAL | 1 refills | Status: DC

## 2024-06-20 DIAGNOSIS — F4322 Adjustment disorder with anxiety: Secondary | ICD-10-CM | POA: Diagnosis not present

## 2024-06-20 DIAGNOSIS — Z630 Problems in relationship with spouse or partner: Secondary | ICD-10-CM | POA: Diagnosis not present

## 2024-07-02 ENCOUNTER — Other Ambulatory Visit: Payer: Self-pay | Admitting: Internal Medicine

## 2024-07-09 ENCOUNTER — Ambulatory Visit (INDEPENDENT_AMBULATORY_CARE_PROVIDER_SITE_OTHER): Admitting: Internal Medicine

## 2024-07-09 ENCOUNTER — Encounter: Payer: Self-pay | Admitting: Internal Medicine

## 2024-07-09 VITALS — BP 124/84 | HR 80 | Temp 98.7°F | Ht 66.25 in | Wt 171.0 lb

## 2024-07-09 DIAGNOSIS — Z0000 Encounter for general adult medical examination without abnormal findings: Secondary | ICD-10-CM | POA: Diagnosis not present

## 2024-07-09 DIAGNOSIS — Z23 Encounter for immunization: Secondary | ICD-10-CM | POA: Diagnosis not present

## 2024-07-09 DIAGNOSIS — E039 Hypothyroidism, unspecified: Secondary | ICD-10-CM

## 2024-07-09 NOTE — Addendum Note (Signed)
 Addended by: KALLIE CLOTILDA SQUIBB on: 07/09/2024 12:21 PM   Modules accepted: Orders

## 2024-07-09 NOTE — Addendum Note (Signed)
 Addended by: JIMMY ADE I on: 07/09/2024 12:12 PM   Modules accepted: Level of Service

## 2024-07-09 NOTE — Assessment & Plan Note (Signed)
 Healthy Multiple life stressors but now doing better Pap through gyn On OCP--discussed some adjustments with period Td today Recommended flu/COVID--she prefers not
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
## 2024-07-09 NOTE — Assessment & Plan Note (Signed)
 Labs in February were normal Doing well on the levothyroxine  175

## 2024-07-15 ENCOUNTER — Other Ambulatory Visit: Payer: Self-pay | Admitting: Medical Genetics

## 2024-07-15 DIAGNOSIS — F4320 Adjustment disorder, unspecified: Secondary | ICD-10-CM | POA: Diagnosis not present

## 2024-07-16 ENCOUNTER — Other Ambulatory Visit

## 2024-10-01 ENCOUNTER — Other Ambulatory Visit: Payer: Self-pay

## 2024-10-01 DIAGNOSIS — J019 Acute sinusitis, unspecified: Secondary | ICD-10-CM | POA: Diagnosis not present

## 2024-10-01 DIAGNOSIS — R110 Nausea: Secondary | ICD-10-CM

## 2024-10-01 MED ORDER — ONDANSETRON HCL 8 MG PO TABS: ORAL_TABLET | ORAL | 1 refills | Status: DC

## 2024-10-01 NOTE — Telephone Encounter (Unsigned)
 Copied from CRM #[id number]. Topic: Clinical - Medication Refill >> Oct 01, 2024  8:29 AM Fonda T wrote: Medication: ondansetron  (ZOFRAN ) 8 MG tablet  Has the patient contacted their pharmacy? Yes, advised to contact office   This is the patient's preferred pharmacy:   [facility] [address], [place] - [address] [place] [address] Phone: [phone] Fax: [phone]  Is this the correct pharmacy for this prescription? Yes If no, delete pharmacy and type the correct one.   Has the prescription been filled recently? Yes  Is the patient out of the medication? Yes  Has the patient been seen for an appointment in the last year OR does the patient have an upcoming appointment? No last seen on 07/09/24, pt reports she seen both Dr. Jimmy and Dr. Bennett on this date of service, and wants to ensure that Dr. Bennett will be taking on her care.  Per chart review, no TOC appt was scheduled, pt declined to schedule as she states previous appt was her TOC per Clotilda in office.  Can we respond through MyChart? No, prefers phone call at [phone] to follow up.  Agent: Please be advised that Rx refills may take up to 3 business days. We ask that you follow-up with your pharmacy.

## 2024-10-08 ENCOUNTER — Telehealth: Payer: Self-pay | Admitting: Internal Medicine

## 2024-10-08 NOTE — Telephone Encounter (Signed)
 Called and scheduled Patient for TOC.   Patient states she would like a refill on her levothyroxine  (SYNTHROID ) 175 MCG table  She states she has 2-3 weeks remaining  States that since her provider  is no longer listed in MyChart that it will not allow her to send message via MyChart    Copied from CRM #[id number]. Topic: General - Other >> Oct 08, 2024  8:30 AM Eva FALCON wrote: Reason for CRM: Patient is calling in and is wondering when Dr. Bennett will show up on My Chart as her PCP. She states at her last visit with Dr. Jimmy, Dr. Bennett and Reena were in the room and that was considered her Transfer of Care appointment. She states she took off work and drove the hour for this appointment and was told she wouldn't need a TOC appt. She is requesting Reena to be involved as she's been her nurse since forever ago and is requesting a call back for clarification.

## 2024-10-27 ENCOUNTER — Telehealth: Payer: Self-pay

## 2024-10-27 ENCOUNTER — Other Ambulatory Visit: Payer: Self-pay | Admitting: Internal Medicine

## 2024-10-27 MED ORDER — LEVOTHYROXINE SODIUM 175 MCG PO TABS: 175.0000 ug | ORAL_TABLET | Freq: Every day | ORAL | 1 refills | Status: DC

## 2024-10-27 NOTE — Telephone Encounter (Signed)
 Pls call pt and update that only TSH lab sent to [facility], T3 and T4 will only need to be added if TSH comes back significantly abnormal. Per chart review I am unable to see TSH lab from earlier this year, can we request records based on wherever pt had it done?

## 2024-10-27 NOTE — Addendum Note (Signed)
 Addended by: Patrica Mendell K on: 10/27/2024 08:48 PM   Modules accepted: Orders

## 2024-10-27 NOTE — Telephone Encounter (Signed)
 Copied from CRM [id number]. Topic: Clinical - Lab/Test Results >> Oct 27, 2024 10:31 AM Antony RAMAN wrote: Reason for CRM: requesting t3, t4 and tsh be sent to [facility]. And please call if these are put in as she can't access her mychart until her toc appointment  [phone]

## 2024-10-27 NOTE — Telephone Encounter (Signed)
 Also, see 10/27/24 phn note.

## 2024-10-27 NOTE — Telephone Encounter (Signed)
 TOC scheduled with Dr. Bennett on 12/13/2023.  Patient is due for thyroid  labs.  Please advise if okay to refill until her [facility] appointment.

## 2024-10-27 NOTE — Telephone Encounter (Unsigned)
 Copied from CRM #[id number]. Topic: Clinical - Prescription Issue >> Oct 27, 2024  4:00 PM Brittany M wrote: Reason for CRM: Patient calling to check on order for labs and medication refill of Synthroid , She called on 11/26 and has not heard back about the refill request- she is running low on medication

## 2024-10-27 NOTE — Telephone Encounter (Signed)
 Addressed, see chart review

## 2024-10-27 NOTE — Telephone Encounter (Unsigned)
 Copied from CRM [id number]. Topic: Clinical - Medication Refill >> Oct 27, 2024 10:29 AM [name] wrote: Medication: levothyroxine  (SYNTHROID ) 175 MCG tablet  Has the patient contacted their pharmacy? Yes (Agent: If no, request that the patient contact the pharmacy for the refill. If patient does not wish to contact the pharmacy document the reason why and proceed with request.) (Agent: If yes, when and what did the pharmacy advise?)  This is the patient's preferred pharmacy:   [facility] [address], [place] - [address] [place] [address] Phone: [phone] Fax: [phone]  Is this the correct pharmacy for this prescription? Yes If no, delete pharmacy and type the correct one.   Has the prescription been filled recently? No  Is the patient out of the medication? Yes  Has the patient been seen for an appointment in the last year OR does the patient have an upcoming appointment? Yes  Can we respond through MyChart? No  Agent: Please be advised that Rx refills may take up to 3 business days. We ask that you follow-up with your pharmacy.

## 2024-10-28 MED ORDER — LEVOTHYROXINE SODIUM 175 MCG PO TABS: 175.0000 ug | ORAL_TABLET | Freq: Every day | ORAL | 0 refills | Status: DC

## 2024-10-28 NOTE — Telephone Encounter (Signed)
 The previous refill request was never sent to our clinical pool so unfortunately, we never saw it.

## 2024-10-28 NOTE — Telephone Encounter (Signed)
 I called and spoke to pt. Explained that when she saw Dr Jimmy 07-09-24 and Dr Shelli was in there with him, she was not actually treating her that day so it cannot be the TOC. I advised her that the message about her refill got lost in the mix somewhere. I went ahead and sent a rx for 90 days of levothyroxine  to her pharmacy in [facility] name as she is doing the refills until people have their TOCs. She understands about the TSH labs. She will it drawn at [facility] near her home in [place].

## 2024-10-28 NOTE — Addendum Note (Signed)
 Addended by: KALLIE CLOTILDA SQUIBB on: 10/28/2024 04:38 PM   Modules accepted: Orders

## 2024-10-29 NOTE — Telephone Encounter (Signed)
 Med sent on 12/16 per chart review

## 2024-10-30 LAB — TSH: TSH: 0.17 m[IU]/L — ABNORMAL LOW

## 2024-11-01 ENCOUNTER — Ambulatory Visit: Payer: Self-pay

## 2024-12-12 ENCOUNTER — Ambulatory Visit (INDEPENDENT_AMBULATORY_CARE_PROVIDER_SITE_OTHER)

## 2024-12-12 VITALS — BP 128/78 | HR 94 | Temp 99.6°F | Ht 66.25 in | Wt 169.0 lb

## 2024-12-12 DIAGNOSIS — Z131 Encounter for screening for diabetes mellitus: Secondary | ICD-10-CM | POA: Diagnosis not present

## 2024-12-12 DIAGNOSIS — Z136 Encounter for screening for cardiovascular disorders: Secondary | ICD-10-CM

## 2024-12-12 DIAGNOSIS — E063 Autoimmune thyroiditis: Secondary | ICD-10-CM | POA: Diagnosis not present

## 2024-12-12 MED ORDER — LEVOTHYROXINE SODIUM 100 MCG PO TABS: 100.0000 ug | ORAL_TABLET | Freq: Every day | ORAL | 0 refills | Status: DC

## 2024-12-12 MED ORDER — LEVOTHYROXINE SODIUM 75 MCG PO TABS: 75.0000 ug | ORAL_TABLET | Freq: Every day | ORAL | 0 refills | Status: DC

## 2024-12-12 NOTE — Progress Notes (Signed)
 "  Subjective:   This visit was conducted in person. The patient gave informed consent to the use of Abridge AI technology to record the contents of the encounter as documented below.   Patient ID: BEZA STEPPE, female    DOB: [birth year], 39 y.o.   MRN: [id number]   Discussed the use of AI scribe software for clinical note transcription with the patient, who gave verbal consent to proceed.  History of Present Illness Judith Martinez is a 39 year old female with Hashimoto's thyroiditis and total thyroidectomy who presents with concerns about thyroid  management.  She has a history of Hashimoto's thyroiditis and underwent a total thyroidectomy in 2023 due to nodules and unmanageable thyroid  levels. She is currently on levothyroxine  175 mcg daily, taken in the morning on an empty stomach. She experiences difficulty losing weight despite a healthy lifestyle. No palpitations, weight loss, diarrhea, or skin changes. Reports weight gain and constipation.  She has a history of migraine headaches, which have improved significantly since her divorce last year. She experiences headaches occasionally but they are rare.  She follows a gluten-free diet for celiac disease, which controls her symptoms effectively. She also has a history of acid reflux, which is well-controlled and not currently problematic.  She reports fibromyalgia with symptoms of body aches and burning sensations in her skin, particularly in her legs and arms. She was previously prescribed gabapentin but found it ineffective and discontinued its use.  She experiences insomnia, sleeping only four to five hours per night, with difficulty both falling and staying asleep. She has tried melatonin without success and reports waking up frequently during the night.  She has chronic right hip pain, which she attributes to training and insufficient stretching. The pain is located in the joint and is worse in the morning, improving with movement. She  recently took prednisone  for biceps tendinitis.  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 She is not currently using steroids but may consider it for future competitions.  She experiences occasional nausea and vomiting episodes, for which she uses Zofran . She has undergone colonoscopy and endoscopy, which revealed celiac disease and GERD but no other abnormalities.  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      Review of Systems  All other systems reviewed and are negative.       Allergies[1]  Medications Ordered Prior to Encounter[2]  BP 128/78 (BP Location: Left Arm, Patient Position: Sitting, Cuff Size: Large)   Pulse 94   Temp 99.6 F (37.6 C) (Oral)   Ht 5' 6.25 (1.683 m)   Wt 169 lb (76.7 kg)   LMP 11/11/2024 (Approximate)   BMI 27.07 kg/m   Objective:      Physical Exam GENERAL: Alert, cooperative, well developed, no acute distress. HEAD: Normocephalic atraumatic. EYES: Extraocular movements intact BL, pupils round, equal and reactive to light BL, conjunctivae normal BL. EXTREMITIES: No cyanosis or edema. NEUROLOGICAL: Oriented to person, place and time, no gait abnormalities, moves all extremities without gross motor or sensory deficit.         Assessment & Plan:   Assessment & Plan Hashimoto's thyroiditis Post-thyroidectomy hypothyroidism  Status post total thyroidectomy in 2023, managed with levothyroxine .  Says she felt better on brand rather than generic, will resend brand version per patient request.  Most recent TSH low at 0.17, she is not agreeable to augment  Synthroid  dose at this time. No hyperthyroid symptoms. Difficulty with weight loss despite diet and exercise. - Switched to Synthroid  175 mcg daily.  Ordered 100 mcg along with 75 mcg of the brand version. - Scheduled follow-up lab visit in 8 weeks to check TSH  levels. - Instructed to contact clinic if insurance issues arise with Synthroid  prescription.    General health maintenance Routine health maintenance discussed. Previous labs normal except slightly elevated hemoglobin. Liver and kidney function normal. Last A1c and cholesterol labs done five years ago. - Schedule fasting labs in 6wks to be done along with TSH: Including A1c and cholesterol.    Return in about 7 months (around 07/09/2025) for CPE, fasting labs 1 wk prior. .   Alijah Akram K Tyreke Kaeser, MD  12/12/24     Contains text generated by Abridge.        [1] No Known Allergies [2]  Current Outpatient Medications on File Prior to Visit  Medication Sig Dispense Refill   ondansetron  (ZOFRAN ) 8 MG tablet DISSOLVE ONE TAB ON TOP OF TONGUE EVERY 8 HOURS AS NEEDED 30 tablet 1   valACYclovir  (VALTREX ) 1000 MG tablet TAKE 2 TABLETS BY MOUTH ONCE FOR 1 DOSE. AT ONSET OF COLD SORE, THEN REPEAT IN 12 HOURS 20 tablet 1   No current facility-administered medications on file prior to visit.   "

## 2024-12-12 NOTE — Progress Notes (Signed)
 Needs referral for GYN in [place] area. Declines Flu Vaccine.

## 2024-12-15 ENCOUNTER — Telehealth

## 2024-12-15 ENCOUNTER — Telehealth: Payer: Self-pay

## 2024-12-15 VITALS — Temp 100.0°F | Ht 66.25 in | Wt 166.0 lb

## 2024-12-15 DIAGNOSIS — J101 Influenza due to other identified influenza virus with other respiratory manifestations: Secondary | ICD-10-CM

## 2024-12-15 DIAGNOSIS — E039 Hypothyroidism, unspecified: Secondary | ICD-10-CM

## 2024-12-15 DIAGNOSIS — R110 Nausea: Secondary | ICD-10-CM

## 2024-12-15 MED ORDER — OSELTAMIVIR PHOSPHATE 75 MG PO CAPS: 75.0000 mg | ORAL_CAPSULE | Freq: Two times a day (BID) | ORAL | 0 refills | Status: AC

## 2024-12-15 MED ORDER — DEXTROMETHORPHAN POLISTIREX ER 30 MG/5ML PO SUER: 30.0000 mg | Freq: Four times a day (QID) | ORAL | 0 refills | Status: AC | PRN

## 2024-12-15 MED ORDER — ONDANSETRON HCL 8 MG PO TABS: ORAL_TABLET | ORAL | 1 refills | Status: AC

## 2024-12-15 MED ORDER — PROMETHAZINE-DM 6.25-15 MG/5ML PO SYRP: 5.0000 mL | ORAL_SOLUTION | Freq: Every evening | ORAL | 0 refills | Status: AC

## 2024-12-15 MED ORDER — LEVOTHYROXINE SODIUM 175 MCG PO TABS: 175.0000 ug | ORAL_TABLET | Freq: Every day | ORAL | 0 refills | Status: AC

## 2024-12-15 MED ORDER — IBUPROFEN 800 MG PO TABS: 800.0000 mg | ORAL_TABLET | Freq: Three times a day (TID) | ORAL | 0 refills | Status: AC | PRN

## 2024-12-15 NOTE — Patient Instructions (Addendum)
 Thank you for visiting [facility] today! Here's what we talked about:  - START Tamiflu , Robitussin for cough during the day (every 6 hours for the next 3 days then as needed afterwards) and promethazine  syrup at night.  Take ibuprofen  for body aches or headaches, with food. - Pick up Synthroid  and Zofran  - Return to clinic if symptoms are not improved at all after completing the Tamiflu  - Work note sent via [employer]

## 2024-12-15 NOTE — Telephone Encounter (Signed)
 Left message to either call the office to get on Dr Charlyn schedule virtually this morning or send me a message back in MyChart that she can do an appointment. We will be available until 2pm today.

## 2024-12-15 NOTE — Telephone Encounter (Signed)
 Please call patient and schedule with me for physical on 07/09/2025
